# Patient Record
Sex: Female | Born: 1980 | Race: White | Hispanic: No | Marital: Married | Smoking: Former smoker
Health system: Southern US, Community
[De-identification: ages and names within clinical notes are randomized; demographics above are authoritative.]

## PROBLEM LIST (undated history)

## (undated) DIAGNOSIS — K219 Gastro-esophageal reflux disease without esophagitis: Secondary | ICD-10-CM

## (undated) DIAGNOSIS — R519 Headache, unspecified: Secondary | ICD-10-CM

## (undated) DIAGNOSIS — R51 Headache: Secondary | ICD-10-CM

## (undated) HISTORY — DX: Headache, unspecified: R51.9

## (undated) HISTORY — DX: Headache: R51

## (undated) HISTORY — PX: OOPHORECTOMY: SHX86

---

## 2004-11-23 ENCOUNTER — Emergency Department: Payer: Self-pay | Admitting: Emergency Medicine

## 2005-10-14 ENCOUNTER — Emergency Department: Payer: Self-pay | Admitting: Emergency Medicine

## 2005-12-05 ENCOUNTER — Emergency Department: Payer: Self-pay | Admitting: Emergency Medicine

## 2006-05-19 ENCOUNTER — Other Ambulatory Visit: Payer: Self-pay

## 2006-05-19 ENCOUNTER — Emergency Department: Payer: Self-pay | Admitting: Emergency Medicine

## 2006-05-20 ENCOUNTER — Ambulatory Visit: Payer: Self-pay | Admitting: Emergency Medicine

## 2006-06-09 ENCOUNTER — Ambulatory Visit: Payer: Self-pay | Admitting: Surgery

## 2006-11-24 ENCOUNTER — Emergency Department: Payer: Self-pay | Admitting: Emergency Medicine

## 2007-06-24 HISTORY — PX: TUBAL LIGATION: SHX77

## 2007-10-19 ENCOUNTER — Observation Stay: Payer: Self-pay | Admitting: Obstetrics and Gynecology

## 2008-09-20 ENCOUNTER — Emergency Department: Payer: Self-pay | Admitting: Emergency Medicine

## 2009-05-13 ENCOUNTER — Emergency Department: Payer: Self-pay | Admitting: Emergency Medicine

## 2009-07-08 ENCOUNTER — Emergency Department: Payer: Self-pay | Admitting: Emergency Medicine

## 2009-09-13 ENCOUNTER — Emergency Department: Payer: Self-pay | Admitting: Emergency Medicine

## 2009-10-08 ENCOUNTER — Ambulatory Visit: Payer: Self-pay | Admitting: Obstetrics & Gynecology

## 2009-10-16 ENCOUNTER — Ambulatory Visit: Payer: Self-pay | Admitting: Obstetrics & Gynecology

## 2010-08-02 ENCOUNTER — Emergency Department: Payer: Self-pay | Admitting: Unknown Physician Specialty

## 2010-11-04 ENCOUNTER — Ambulatory Visit: Payer: Self-pay | Admitting: Obstetrics & Gynecology

## 2011-04-03 ENCOUNTER — Emergency Department: Payer: Self-pay | Admitting: Emergency Medicine

## 2011-06-02 ENCOUNTER — Emergency Department: Payer: Self-pay | Admitting: Emergency Medicine

## 2011-07-04 ENCOUNTER — Emergency Department: Payer: Self-pay | Admitting: Unknown Physician Specialty

## 2011-07-05 ENCOUNTER — Emergency Department: Payer: Self-pay | Admitting: Emergency Medicine

## 2011-07-15 ENCOUNTER — Encounter (HOSPITAL_COMMUNITY): Payer: Self-pay | Admitting: Emergency Medicine

## 2011-07-15 ENCOUNTER — Emergency Department (HOSPITAL_COMMUNITY)
Admission: EM | Admit: 2011-07-15 | Discharge: 2011-07-15 | Discharge status: Against Medical Advice | Payer: Self-pay | Attending: Emergency Medicine | Admitting: Emergency Medicine

## 2011-07-15 DIAGNOSIS — R5381 Other malaise: Secondary | ICD-10-CM | POA: Insufficient documentation

## 2011-07-15 DIAGNOSIS — R110 Nausea: Secondary | ICD-10-CM | POA: Insufficient documentation

## 2011-07-15 DIAGNOSIS — K219 Gastro-esophageal reflux disease without esophagitis: Secondary | ICD-10-CM | POA: Insufficient documentation

## 2011-07-15 HISTORY — DX: Gastro-esophageal reflux disease without esophagitis: K21.9

## 2011-07-15 NOTE — ED Notes (Signed)
UNABLE TO LOCATE PT. AT TRIAGE SEVERAL TIMES.  

## 2011-07-15 NOTE — ED Notes (Signed)
Pt c/o nausea and fatigue with acid reflux and dry cough x several months

## 2011-08-21 ENCOUNTER — Emergency Department: Payer: Self-pay | Admitting: *Deleted

## 2011-09-27 ENCOUNTER — Emergency Department: Payer: Self-pay | Admitting: *Deleted

## 2011-12-18 ENCOUNTER — Emergency Department: Payer: Self-pay | Admitting: Emergency Medicine

## 2013-05-17 ENCOUNTER — Emergency Department: Payer: Self-pay | Admitting: Emergency Medicine

## 2014-07-18 ENCOUNTER — Emergency Department: Payer: Self-pay | Admitting: Emergency Medicine

## 2014-07-18 LAB — CBC WITH DIFFERENTIAL/PLATELET
BASOS ABS: 0 10*3/uL (ref 0.0–0.1)
BASOS PCT: 0.5 %
EOS PCT: 1.1 %
Eosinophil #: 0.1 10*3/uL (ref 0.0–0.7)
HCT: 39.8 % (ref 35.0–47.0)
HGB: 13.4 g/dL (ref 12.0–16.0)
Lymphocyte #: 2.4 10*3/uL (ref 1.0–3.6)
Lymphocyte %: 32.3 %
MCH: 30.7 pg (ref 26.0–34.0)
MCHC: 33.5 g/dL (ref 32.0–36.0)
MCV: 92 fL (ref 80–100)
Monocyte #: 0.6 x10 3/mm (ref 0.2–0.9)
Monocyte %: 8.1 %
NEUTROS PCT: 58 %
Neutrophil #: 4.4 10*3/uL (ref 1.4–6.5)
Platelet: 295 10*3/uL (ref 150–440)
RBC: 4.35 10*6/uL (ref 3.80–5.20)
RDW: 13.2 % (ref 11.5–14.5)
WBC: 7.6 10*3/uL (ref 3.6–11.0)

## 2014-07-18 LAB — URINALYSIS, COMPLETE
BILIRUBIN, UR: NEGATIVE
Blood: NEGATIVE
GLUCOSE, UR: NEGATIVE mg/dL (ref 0–75)
Nitrite: NEGATIVE
Ph: 5 (ref 4.5–8.0)
Protein: NEGATIVE
Specific Gravity: 1.01 (ref 1.003–1.030)
Transitional Epi: 1

## 2014-07-18 LAB — COMPREHENSIVE METABOLIC PANEL
ALBUMIN: 4.2 g/dL (ref 3.4–5.0)
ALK PHOS: 58 U/L (ref 46–116)
ALT: 22 U/L (ref 14–63)
AST: 31 U/L (ref 15–37)
Anion Gap: 7 (ref 7–16)
BUN: 9 mg/dL (ref 7–18)
Bilirubin,Total: 0.3 mg/dL (ref 0.2–1.0)
CHLORIDE: 104 mmol/L (ref 98–107)
Calcium, Total: 9.4 mg/dL (ref 8.5–10.1)
Co2: 29 mmol/L (ref 21–32)
Creatinine: 0.75 mg/dL (ref 0.60–1.30)
Glucose: 87 mg/dL (ref 65–99)
Osmolality: 277 (ref 275–301)
POTASSIUM: 4 mmol/L (ref 3.5–5.1)
SODIUM: 140 mmol/L (ref 136–145)
TOTAL PROTEIN: 7.8 g/dL (ref 6.4–8.2)

## 2014-07-18 LAB — LIPASE, BLOOD: LIPASE: 101 U/L (ref 73–393)

## 2014-07-20 LAB — URINE CULTURE

## 2014-12-27 ENCOUNTER — Emergency Department: Payer: Worker's Compensation

## 2014-12-27 ENCOUNTER — Encounter: Payer: Self-pay | Admitting: *Deleted

## 2014-12-27 ENCOUNTER — Emergency Department
Admission: EM | Admit: 2014-12-27 | Discharge: 2014-12-27 | Disposition: A | Discharge status: Home / Self Care | Payer: Worker's Compensation | Attending: Emergency Medicine | Admitting: Emergency Medicine

## 2014-12-27 DIAGNOSIS — X58XXXA Exposure to other specified factors, initial encounter: Secondary | ICD-10-CM | POA: Insufficient documentation

## 2014-12-27 DIAGNOSIS — Y9289 Other specified places as the place of occurrence of the external cause: Secondary | ICD-10-CM | POA: Insufficient documentation

## 2014-12-27 DIAGNOSIS — S93401A Sprain of unspecified ligament of right ankle, initial encounter: Secondary | ICD-10-CM | POA: Diagnosis not present

## 2014-12-27 DIAGNOSIS — Y9389 Activity, other specified: Secondary | ICD-10-CM | POA: Diagnosis not present

## 2014-12-27 DIAGNOSIS — Z720 Tobacco use: Secondary | ICD-10-CM | POA: Diagnosis not present

## 2014-12-27 DIAGNOSIS — Y998 Other external cause status: Secondary | ICD-10-CM | POA: Diagnosis not present

## 2014-12-27 DIAGNOSIS — S99911A Unspecified injury of right ankle, initial encounter: Secondary | ICD-10-CM | POA: Diagnosis present

## 2014-12-27 MED ORDER — HYDROCODONE-ACETAMINOPHEN 5-325 MG PO TABS: 1.0000 | ORAL_TABLET | ORAL | Status: DC | PRN

## 2014-12-27 NOTE — ED Notes (Signed)
[facility] [employer] [employer] due to twisted ankle while at work. Pt was taking recyclables out to trash when tripped over "a pile of rocks that I slipped on and twisted my right ankle." Pt states pain 6/10, no deformity noted. Pt ambulatory to triage, pedal pulses present and strong bilaterally. Vitals wnl, no acute distress noted.

## 2014-12-27 NOTE — ED Provider Notes (Signed)
[facility] Emergency Department Provider Note  ____________________________________________  Time seen: Approximately 2:25 PM  I have reviewed the triage vital signs and the nursing notes.   HISTORY  Chief Complaint Ankle Pain    HPI Leah Christensen is a 34 y.o. female presents for evaluation of right ankle pain. States that she twisted her ankle on some rocks and heard a "pop" sound.Increased pain with ambulation and weightbearing. Pain is both laterally and medially. Presently 5/10.   Past Medical History  Diagnosis Date  . Acid reflux     Patient Active Problem List   Diagnosis Date Noted  . Acid reflux     History reviewed. No pertinent past surgical history.  Current Outpatient Rx  Name  Route  Sig  Dispense  Refill  . HYDROcodone-acetaminophen (NORCO) 5-325 MG per tablet   Oral   Take 1-2 tablets by mouth every 4 (four) hours as needed for moderate pain.   15 tablet   0     Allergies Review of patient's allergies indicates no known allergies.  History reviewed. No pertinent family history.  Social History History  Substance Use Topics  . Smoking status: Current Every Day Smoker  . Smokeless tobacco: Not on file  . Alcohol Use: No     Comment: occ    Review of Systems Constitutional: No fever/chills Eyes: No visual changes. ENT: No sore throat. Cardiovascular: Denies chest pain. Respiratory: Denies shortness of breath. Gastrointestinal: No abdominal pain.  No nausea, no vomiting.  No diarrhea.  No constipation. Genitourinary: Negative for dysuria. Musculoskeletal: Positive for right ankle pain and swelling. Skin: Negative for rash. Neurological: Negative for headaches, focal weakness or numbness.  10-point ROS otherwise negative.  ____________________________________________   PHYSICAL EXAM:  VITAL SIGNS: ED Triage Vitals  Enc Vitals Group     BP 12/27/14 1354 107/65 mmHg     Pulse Rate 12/27/14 1354 82   Resp 12/27/14 1354 20     Temp 12/27/14 1354 98.3 F (36.8 C)     Temp Source 12/27/14 1354 Oral     SpO2 12/27/14 1354 99 %     Weight 12/27/14 1354 110 lb (49.896 kg)     Height 12/27/14 1354 5' (1.524 m)     Head Cir --      Peak Flow --      Pain Score 12/27/14 1354 5     Pain Loc --      Pain Edu? --      Excl. in GC? --     Constitutional: Alert and oriented. Well appearing and in no acute distress. Eyes: Conjunctivae are normal. PERRL. EOMI. Head: Atraumatic. Nose: No congestion/rhinnorhea. Mouth/Throat: Mucous membranes are moist.  Oropharynx non-erythematous. Neck: No stridor.   Cardiovascular: Normal rate, regular rhythm. Grossly normal heart sounds.  Good peripheral circulation. Respiratory: Normal respiratory effort.  No retractions. Lungs CTAB. Gastrointestinal: Soft and nontender. No distention. No abdominal bruits. No CVA tenderness. Musculoskeletal: Limited range of motion right ankle increased pain with adduction. Okay with flinching and extension. laterallygreater than medially. Pedal pulse noted Preston strong bilaterally. Neurovascularly intact distally. Neurologic:  Normal speech and language. No gross focal neurologic deficits are appreciated. Speech is normal. No gait instability. Skin:  Skin is warm, dry and intact. No rash noted. Psychiatric: Mood and affect are normal. Speech and behavior are normal.  ____________________________________________   LABS (all labs ordered are listed, but only abnormal results are displayed)  Labs Reviewed - No data to display ____________________________________________  RADIOLOGY  Negative for fracture. Interpreted by radiologist and reviewed by myself. ____________________________________________   PROCEDURES  Procedure(s) performed: None  Critical Care performed: No  ____________________________________________   INITIAL IMPRESSION / ASSESSMENT AND PLAN / ED COURSE  Pertinent labs & imaging results that  were available during my care of the patient were reviewed by me and considered in my medical decision making (see chart for details).  2 right ankle sprain. Rx given for hydrocodone 10/24/2023 encouraged ibuprofen over-the-counter. Prescription crutches given as well as ankle stirrup. She to return to the ER with any worsening symptomology otherwise she voices no other emergency medical complaints at this visit. ____________________________________________   FINAL CLINICAL IMPRESSION(S) / ED DIAGNOSES  Final diagnoses:  Ankle sprain, right, initial encounter      Evangeline Dakin, PA-C 12/27/14 1552  Darien Ramus, MD 12/28/14 [phone]

## 2014-12-27 NOTE — Discharge Instructions (Signed)

## 2015-01-22 ENCOUNTER — Emergency Department
Admission: EM | Admit: 2015-01-22 | Discharge: 2015-01-23 | Disposition: A | Discharge status: Home / Self Care | Payer: Self-pay | Attending: Emergency Medicine | Admitting: Emergency Medicine

## 2015-01-22 ENCOUNTER — Emergency Department: Payer: Self-pay

## 2015-01-22 ENCOUNTER — Encounter: Payer: Self-pay | Admitting: *Deleted

## 2015-01-22 DIAGNOSIS — Z3202 Encounter for pregnancy test, result negative: Secondary | ICD-10-CM | POA: Insufficient documentation

## 2015-01-22 DIAGNOSIS — K529 Noninfective gastroenteritis and colitis, unspecified: Secondary | ICD-10-CM | POA: Insufficient documentation

## 2015-01-22 DIAGNOSIS — R109 Unspecified abdominal pain: Secondary | ICD-10-CM

## 2015-01-22 LAB — CBC WITH DIFFERENTIAL/PLATELET
BASOS PCT: 0 %
Basophils Absolute: 0 10*3/uL (ref 0–0.1)
EOS PCT: 1 %
Eosinophils Absolute: 0.1 10*3/uL (ref 0–0.7)
HEMATOCRIT: 42.7 % (ref 35.0–47.0)
HEMOGLOBIN: 14.3 g/dL (ref 12.0–16.0)
LYMPHS ABS: 0.5 10*3/uL — AB (ref 1.0–3.6)
LYMPHS PCT: 4 %
MCH: 30.9 pg (ref 26.0–34.0)
MCHC: 33.5 g/dL (ref 32.0–36.0)
MCV: 92.3 fL (ref 80.0–100.0)
MONO ABS: 0.7 10*3/uL (ref 0.2–0.9)
MONOS PCT: 5 %
NEUTROS ABS: 12 10*3/uL — AB (ref 1.4–6.5)
Neutrophils Relative %: 90 %
Platelets: 230 10*3/uL (ref 150–440)
RBC: 4.63 MIL/uL (ref 3.80–5.20)
RDW: 13.7 % (ref 11.5–14.5)
WBC: 13.3 10*3/uL — ABNORMAL HIGH (ref 3.6–11.0)

## 2015-01-22 LAB — URINALYSIS COMPLETE WITH MICROSCOPIC (ARMC ONLY)
Bacteria, UA: NONE SEEN
Bilirubin Urine: NEGATIVE
Glucose, UA: NEGATIVE mg/dL
HGB URINE DIPSTICK: NEGATIVE
NITRITE: NEGATIVE
PH: 5 (ref 5.0–8.0)
PROTEIN: NEGATIVE mg/dL
RBC / HPF: NONE SEEN RBC/hpf (ref 0–5)
SPECIFIC GRAVITY, URINE: 1.023 (ref 1.005–1.030)

## 2015-01-22 LAB — COMPREHENSIVE METABOLIC PANEL
ALBUMIN: 4.8 g/dL (ref 3.5–5.0)
ALT: 17 U/L (ref 14–54)
AST: 26 U/L (ref 15–41)
Alkaline Phosphatase: 48 U/L (ref 38–126)
Anion gap: 7 (ref 5–15)
BUN: 13 mg/dL (ref 6–20)
CHLORIDE: 103 mmol/L (ref 101–111)
CO2: 26 mmol/L (ref 22–32)
CREATININE: 0.85 mg/dL (ref 0.44–1.00)
Calcium: 9.3 mg/dL (ref 8.9–10.3)
GFR calc non Af Amer: >60 mL/min (ref >60)
Glucose, Bld: 100 mg/dL — ABNORMAL HIGH (ref 65–99)
POTASSIUM: 4 mmol/L (ref 3.5–5.1)
Sodium: 136 mmol/L (ref 135–145)
TOTAL PROTEIN: 7.8 g/dL (ref 6.5–8.1)
Total Bilirubin: 0.8 mg/dL (ref 0.3–1.2)

## 2015-01-22 LAB — POCT PREGNANCY, URINE: Preg Test, Ur: NEGATIVE

## 2015-01-22 MED ORDER — MORPHINE SULFATE 4 MG/ML IJ SOLN
4.0000 mg | Freq: Once | INTRAMUSCULAR | Status: AC
  Administered 2015-01-22: 4 mg via INTRAVENOUS
  Filled 2015-01-22: qty 1

## 2015-01-22 MED ORDER — ONDANSETRON HCL 4 MG/2ML IJ SOLN
4.0000 mg | Freq: Once | INTRAMUSCULAR | Status: AC
  Administered 2015-01-22: 4 mg via INTRAVENOUS
  Filled 2015-01-22: qty 2

## 2015-01-22 MED ORDER — SODIUM CHLORIDE 0.9 % IV SOLN
Freq: Once | INTRAVENOUS | Status: AC
  Administered 2015-01-22: 22:00:00 via INTRAVENOUS

## 2015-01-22 MED ORDER — KETOROLAC TROMETHAMINE 30 MG/ML IJ SOLN
30.0000 mg | Freq: Once | INTRAMUSCULAR | Status: AC
  Administered 2015-01-22: 30 mg via INTRAVENOUS
  Filled 2015-01-22: qty 10

## 2015-01-22 MED ORDER — IOHEXOL 300 MG/ML  SOLN
75.0000 mL | Freq: Once | INTRAMUSCULAR | Status: AC | PRN
  Administered 2015-01-22: 75 mL via INTRAVENOUS

## 2015-01-22 NOTE — ED Notes (Signed)
poct urine pregnancy-NEGATIVE 

## 2015-01-22 NOTE — ED Provider Notes (Signed)
[facility] Emergency Department Provider Note     Time seen: ----------------------------------------- 9:02 PM on 01/22/2015 -----------------------------------------    I have reviewed the triage vital signs and the nursing notes.   HISTORY  Chief Complaint Abdominal Pain    HPI DIAMOND JENTZ is a 34 y.o. female who presents ER with nausea vomiting diarrhea today. Patient states symptoms began at 8:30 this morning. States the cramping has gotten worse today. No fever, does have chills. She's had no vaginal bleeding or dysuria. Most complained of pain in the right side.   Past Medical History  Diagnosis Date  . Acid reflux     Patient Active Problem List   Diagnosis Date Noted  . Acid reflux     No past surgical history on file.  Allergies Review of patient's allergies indicates no known allergies.  Social History History  Substance Use Topics  . Smoking status: Former Games developer  . Smokeless tobacco: Not on file  . Alcohol Use: No     Comment: occ    Review of Systems Constitutional: Negative for fever. Positive for chills Eyes: Negative for visual changes. ENT: Negative for sore throat. Cardiovascular: Negative for chest pain. Respiratory: Negative for shortness of breath. Gastrointestinal: Positive for abdominal pain, vomiting and diarrhea Genitourinary: Negative for dysuria. Musculoskeletal: Negative for back pain. Positive for right-sided back pain Skin: Negative for rash. Neurological: Negative for headaches, focal weakness or numbness.  10-point ROS otherwise negative.  ____________________________________________   PHYSICAL EXAM:  VITAL SIGNS: ED Triage Vitals  Enc Vitals Group     BP 01/22/15 1941 101/70 mmHg     Pulse Rate 01/22/15 1941 98     Resp 01/22/15 1941 18     Temp 01/22/15 1941 98.6 F (37 C)     Temp Source 01/22/15 1941 Oral     SpO2 01/22/15 1941 99 %     Weight 01/22/15 1941 113 lb (51.256  kg)     Height 01/22/15 1941 5' (1.524 m)     Head Cir --      Peak Flow --      Pain Score 01/22/15 1943 8     Pain Loc --      Pain Edu? --      Excl. in GC? --     Constitutional: Alert and oriented. Mild distress Eyes: Conjunctivae are normal. PERRL. Normal extraocular movements. ENT   Head: Normocephalic and atraumatic.   Nose: No congestion/rhinnorhea.   Mouth/Throat: Mucous membranes are moist.   Neck: No stridor. Cardiovascular: Normal rate, regular rhythm. Normal and symmetric distal pulses are present in all extremities. No murmurs, rubs, or gallops. Respiratory: Normal respiratory effort without tachypnea nor retractions. Breath sounds are clear and equal bilaterally. No wheezes/rales/rhonchi. Gastrointestinal: Mild right-sided nonfocal abdominal tenderness, no rebound or guarding. Musculoskeletal: Nontender with normal range of motion in all extremities. No joint effusions.  No lower extremity tenderness nor edema. Neurologic:  Normal speech and language. No gross focal neurologic deficits are appreciated. Speech is normal. No gait instability. Skin:  Skin is warm, dry and intact. No rash noted. Psychiatric: Mood and affect are normal. Speech and behavior are normal. Patient exhibits appropriate insight and judgment.  ____________________________________________  ED COURSE:  Pertinent labs & imaging results that were available during my care of the patient were reviewed by me and considered in my medical decision making (see chart for details). Patient will need IV fluid, morphine and Zofran. This appears to be gastroenteritis. ____________________________________________  LABS (pertinent positives/negatives)  Labs Reviewed  CBC WITH DIFFERENTIAL/PLATELET - Abnormal; Notable for the following:    WBC 13.3 (*)    Neutro Abs 12.0 (*)    Lymphs Abs 0.5 (*)    All other components within normal limits  COMPREHENSIVE METABOLIC PANEL - Abnormal; Notable  for the following:    Glucose, Bld 100 (*)    All other components within normal limits  URINALYSIS COMPLETEWITH MICROSCOPIC ([facility] ONLY) - Abnormal; Notable for the following:    Color, Urine YELLOW (*)    APPearance HAZY (*)    Ketones, ur 2+ (*)    Leukocytes, UA 1+ (*)    Squamous Epithelial / LPF 6-30 (*)    All other components within normal limits  POC URINE PREG, ED  POCT PREGNANCY, URINE    RADIOLOGY Images were viewed by me  Abdomen 2 view is unremarkable  ____________________________________________  FINAL ASSESSMENT AND PLAN  Gastroenteritis  Plan: Patient with labs and imaging as dictated above. Patient was given saline, morphine, Zofran, Toradol. She is stable for outpatient follow-up for Dr.   Mayford Knife, Cecille Amsterdam, MD   Emily Filbert, MD 01/22/15 2119

## 2015-01-22 NOTE — ED Notes (Signed)
Pt to CT

## 2015-01-22 NOTE — ED Notes (Signed)
Returned from ct 

## 2015-01-22 NOTE — ED Notes (Signed)
Pt has abd pain with n/v/d today.  Sx began at 0830 this morning.  Pt sates the cramping has gotten worse today.  No vag bleeding.   No dysuria.  Pt has low back.

## 2015-01-23 MED ORDER — DICYCLOMINE HCL 10 MG PO CAPS: 10.0000 mg | ORAL_CAPSULE | Freq: Three times a day (TID) | ORAL | Status: DC

## 2015-01-23 MED ORDER — DICYCLOMINE HCL 10 MG PO CAPS
10.0000 mg | ORAL_CAPSULE | Freq: Once | ORAL | Status: AC
  Administered 2015-01-23: 10 mg via ORAL
  Filled 2015-01-23: qty 1

## 2015-01-23 MED ORDER — LOPERAMIDE HCL 2 MG PO CAPS
4.0000 mg | ORAL_CAPSULE | Freq: Once | ORAL | Status: AC
  Administered 2015-01-23: 4 mg via ORAL
  Filled 2015-01-23: qty 2

## 2015-01-23 NOTE — Discharge Instructions (Signed)

## 2015-01-23 NOTE — ED Provider Notes (Signed)
I assumed care of the patient at 11:00 PM from Dr. Shaune Pollack. She scan of the abdomen revealed:    CT Abdomen Pelvis W Contrast (Final result) Result time: 01/22/15 23:23:03   Final result by Rad Results In Interface (01/22/15 23:23:03)   Narrative:   CLINICAL DATA: Acute onset of generalized abdominal pain, nausea, vomiting and diarrhea. Initial encounter.  EXAM: CT ABDOMEN AND PELVIS WITH CONTRAST  TECHNIQUE: Multidetector CT imaging of the abdomen and pelvis was performed using the standard protocol following bolus administration of intravenous contrast.  CONTRAST: 75mL OMNIPAQUE IOHEXOL 300 MG/ML SOLN  COMPARISON: CT of the abdomen and pelvis from 07/18/2014  FINDINGS: The visualized lung bases are clear.  The liver and spleen are unremarkable in appearance. The patient is status post cholecystectomy, with clips noted at the gallbladder fossa. The pancreas and adrenal glands are unremarkable.  The kidneys are unremarkable in appearance. There is no evidence of hydronephrosis. No renal or ureteral stones are seen. No perinephric stranding is appreciated.  The small bowel is unremarkable in appearance. The stomach is within normal limits. No acute vascular abnormalities are seen.  The appendix is not definitely seen; there is no evidence of appendicitis. The colon is partially filled with fluid and is grossly unremarkable in appearance.  The bladder is mildly distended and grossly unremarkable. The uterus is within normal limits. The right ovary is unremarkable in appearance. Trace free fluid within the pelvis is likely physiologic in nature. No suspicious adnexal masses are seen. No inguinal lymphadenopathy is seen.  No acute osseous abnormalities are identified.  IMPRESSION: 1. No acute abnormality seen within the abdomen or pelvis. 2. Trace free fluid in the pelvis is likely physiologic in nature.   Electronically Signed   Patient given Imodium 4 mg and  Bentyl 10 mg by mouth. Patient will be prescribed the same at home.  Darci Current, MD 01/23/15 [id number]

## 2015-04-27 ENCOUNTER — Encounter: Payer: Self-pay | Admitting: Primary Care

## 2015-04-27 ENCOUNTER — Ambulatory Visit (INDEPENDENT_AMBULATORY_CARE_PROVIDER_SITE_OTHER): Payer: 59 | Admitting: Primary Care

## 2015-04-27 VITALS — BP 96/64 | HR 83 | Temp 98.1°F | Ht 60.0 in | Wt 113.4 lb

## 2015-04-27 DIAGNOSIS — R059 Cough, unspecified: Secondary | ICD-10-CM

## 2015-04-27 DIAGNOSIS — N644 Mastodynia: Secondary | ICD-10-CM | POA: Diagnosis not present

## 2015-04-27 DIAGNOSIS — R05 Cough: Secondary | ICD-10-CM

## 2015-04-27 DIAGNOSIS — R51 Headache: Secondary | ICD-10-CM

## 2015-04-27 DIAGNOSIS — R519 Headache, unspecified: Secondary | ICD-10-CM | POA: Insufficient documentation

## 2015-04-27 MED ORDER — AMOXICILLIN 400 MG/5ML PO SUSR: ORAL | Status: DC

## 2015-04-27 MED ORDER — HYDROCODONE-HOMATROPINE 5-1.5 MG/5ML PO SYRP: 5.0000 mL | ORAL_SOLUTION | Freq: Every evening | ORAL | Status: DC | PRN

## 2015-04-27 NOTE — Patient Instructions (Signed)
Start Amoxicillin suspension antibiotics. Take 10 ml by mouth twice daily for 7 days. Please notify me if you do not start feeling better.  You may take the Hycodan at bedtime for cough and sleep. If this is too expensive, try taking Nyquil which may be purchased over the counter.  Stop by the front and speak with Shirlee LimerickMarion regarding your mammogram.  It was a pleasure to meet you today! Please don't hesitate to call me with any questions. Welcome to [facility]!

## 2015-04-27 NOTE — Progress Notes (Signed)
Pre visit review using our clinic review tool, if applicable. No additional management support is needed unless otherwise documented below in the visit note. 

## 2015-04-27 NOTE — Assessment & Plan Note (Signed)
Bilateral breasts. Exam with change in density to left breast at 3 o'clock position. Sent for diagnotic mammogram with ultrasound bilaterally. No FH of breast cancer.

## 2015-04-27 NOTE — Assessment & Plan Note (Signed)
Occur twice weekly. +photophobia and nausea. Improved with BC powder and rest. Discussed to stop BC powder, try tylenol or excedrin migraine. She is to update me if HA's become more frequent/worse.

## 2015-04-27 NOTE — Progress Notes (Signed)
   Subjective:    Patient ID: Leah Christensen, female    DOB: 05/15/1981, 34 y.o.   MRN: [id number]  HPI  Ms. Leah Christensen is a 34 year old female who presents today to establish care and discuss the problems mentioned below. Will obtain old records.  1) Cough: Her symptoms first began Thursday last week (10/27) and started with a cough. Over the past week she's started to feel fatigued, developed a sore throat, left ear pain, nasal congestion. Her cough is productive with green sputum. She's been taking alka-seltzer cold and flu tablets without improvement. Nothing improves her symptoms.  2) Frequent headaches: Headaches occur twice weekly. 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 Her headaches are located to the frontal lobe. She will get photophobia and nausea. She will take Surgisite BostonBC powder with improvement.   3) Breast pain: Present for the past 2 months. Located to bilateral breasts. She describes her pain as a constant soreness. Denies discharge from nipples. She's been checking her breasts regularly in the shower and believes she may feel a mass to the left breast at the 3 o'clock position. No family history of breast cancer.  Review of Systems  Constitutional: Negative for unexpected weight change.  HENT: Positive for congestion, ear pain, postnasal drip and sore throat. Negative for sinus pressure.   Respiratory: Positive for cough. Negative for shortness of breath.   Cardiovascular: Negative for chest pain.       Breast pain  Gastrointestinal: Negative for diarrhea and constipation.  Genitourinary: Negative for difficulty urinating.       Regular periods  Musculoskeletal: Negative for myalgias and arthralgias.  Skin: Negative for rash.  Neurological: Positive for numbness and headaches. Negative for dizziness.       Numbness to right hand since FOOSH at age 34.  Psychiatric/Behavioral:       Denies concerns for anxiety or depression         Objective:   Physical Exam  Constitutional: She is oriented to person, place, and time. She appears well-nourished.  HENT:  Right Ear: Tympanic membrane and ear canal normal.  Left Ear: Tympanic membrane and ear canal normal.  Nose: Right sinus exhibits no maxillary sinus tenderness and no frontal sinus tenderness. Left sinus exhibits no maxillary sinus tenderness and no frontal sinus tenderness.  Mouth/Throat: Posterior oropharyngeal erythema present. No oropharyngeal exudate or posterior oropharyngeal edema.  Neck: Neck supple.  Cardiovascular: Normal rate and regular rhythm.   Pulmonary/Chest: Effort normal and breath sounds normal. Right breast exhibits tenderness. Right breast exhibits no mass and no skin change. Left breast exhibits tenderness. Left breast exhibits no skin change. Breasts are symmetrical.    Deep firmness noted to breast at 3 o'clock position.   Lymphadenopathy:    She has cervical adenopathy.  Neurological: She is alert and oriented to person, place, and time.  Skin: Skin is warm and dry.  Psychiatric: She has a normal mood and affect.          Assessment & Plan:  Acute Bronchitis:  Cough x 2 weeks, now with sore throat, fatigue, production with green sputum. Exam mostly unremarkable, although appears ill. RX for amoxicillin suspension as she has difficulty swallowing pills, Hycodan cough syrup PRN HS. Fluids, rest. Follow up PRN.

## 2015-05-02 ENCOUNTER — Other Ambulatory Visit: Payer: Self-pay | Admitting: Primary Care

## 2015-05-02 DIAGNOSIS — Z0000 Encounter for general adult medical examination without abnormal findings: Secondary | ICD-10-CM

## 2015-05-07 ENCOUNTER — Ambulatory Visit
Admission: RE | Admit: 2015-05-07 | Discharge: 2015-05-07 | Disposition: A | Discharge status: Home / Self Care | Payer: 59 | Source: Ambulatory Visit | Attending: Primary Care | Admitting: Primary Care

## 2015-05-07 ENCOUNTER — Telehealth: Payer: Self-pay | Admitting: Primary Care

## 2015-05-07 ENCOUNTER — Other Ambulatory Visit: Payer: Self-pay | Admitting: Primary Care

## 2015-05-07 DIAGNOSIS — N644 Mastodynia: Secondary | ICD-10-CM

## 2015-05-07 NOTE — Telephone Encounter (Signed)
Opened in error pt wanted dr appointment

## 2015-05-09 ENCOUNTER — Encounter: Payer: Self-pay | Admitting: Family Medicine

## 2015-05-09 ENCOUNTER — Ambulatory Visit (INDEPENDENT_AMBULATORY_CARE_PROVIDER_SITE_OTHER): Payer: 59 | Admitting: Family Medicine

## 2015-05-09 VITALS — BP 100/60 | HR 76 | Temp 98.7°F | Wt 113.2 lb

## 2015-05-09 DIAGNOSIS — N63 Unspecified lump in unspecified breast: Secondary | ICD-10-CM

## 2015-05-09 DIAGNOSIS — Z0000 Encounter for general adult medical examination without abnormal findings: Secondary | ICD-10-CM

## 2015-05-09 NOTE — Progress Notes (Signed)
Pre visit review using our clinic review tool, if applicable. No additional management support is needed unless otherwise documented below in the visit note.  F/u for mammogram.  Possible mass in R breast.   Per patient, was given options: f/u imaging vs biopsy vs excision (with biopsy prev).    She had some R>L breast pain that led imaging with the results noted.  No discharge from the nipple.   No known FH breast cancer.   D/w pt about her imaging report, probable benign lesion in the right breast.  Needed referral for consideration of biopsy. General pros and cons d/w pt about continued surveillance vs biopsy.    Due for labs ordered prev by PCP.    Meds, vitals, and allergies reviewed.   ROS: See HPI.  Otherwise, noncontributory.  nad ncat rrr ctab Breast exam not done.

## 2015-05-09 NOTE — Patient Instructions (Signed)
Go to the lab on the way out.  We'll contact you with your lab report. Marion will call about your referral. Take care.  Glad to see you.  

## 2015-05-10 ENCOUNTER — Telehealth: Payer: Self-pay | Admitting: Family Medicine

## 2015-05-10 DIAGNOSIS — N63 Unspecified lump in unspecified breast: Secondary | ICD-10-CM | POA: Insufficient documentation

## 2015-05-10 LAB — LIPID PANEL
CHOL/HDL RATIO: 3
Cholesterol: 128 mg/dL (ref 0–200)
HDL: 45.1 mg/dL (ref >39.00)
LDL CALC: 67 mg/dL (ref 0–99)
NonHDL: 82.53
TRIGLYCERIDES: 78 mg/dL (ref 0.0–149.0)
VLDL: 15.6 mg/dL (ref 0.0–40.0)

## 2015-05-10 LAB — COMPREHENSIVE METABOLIC PANEL
ALK PHOS: 47 U/L (ref 39–117)
ALT: 11 U/L (ref 0–35)
AST: 19 U/L (ref 0–37)
Albumin: 4.3 g/dL (ref 3.5–5.2)
BUN: 9 mg/dL (ref 6–23)
CHLORIDE: 104 meq/L (ref 96–112)
CO2: 27 meq/L (ref 19–32)
Calcium: 9.6 mg/dL (ref 8.4–10.5)
Creatinine, Ser: 0.78 mg/dL (ref 0.40–1.20)
GFR: 89.52 mL/min (ref >60.00)
GLUCOSE: 91 mg/dL (ref 70–99)
POTASSIUM: 3.7 meq/L (ref 3.5–5.1)
SODIUM: 140 meq/L (ref 135–145)
TOTAL PROTEIN: 7.2 g/dL (ref 6.0–8.3)
Total Bilirubin: 0.2 mg/dL (ref 0.2–1.2)

## 2015-05-10 LAB — TSH: TSH: 0.89 u[IU]/mL (ref 0.35–4.50)

## 2015-05-10 NOTE — Telephone Encounter (Signed)
I want patient to talk to Dr. Baird Lyonsina Arceo first.   The patient mentioned that biopsy had been offered, but it isn't mentioned in the reports.  I'll route this to Dr. Judyann MunsonArceo for input.

## 2015-05-10 NOTE — Assessment & Plan Note (Addendum)
D/w pt about options in general.  Probable benign lesion in the right breast. In my experience, most women in her situation usually get f/u imaging done and not a biopsy, at least not initially.  I did put in the referral for consideration of biopsy.   She'll consider options.   I'll ask that pt speak with Leah Christensen again in the meantime, with note routed to Leah Christensen and PCP.  Pt agrees.    Addendum- I see on review that Dr. Judyann Christensen already did d/w pt about possible f/u imaging vs bx vs excision.  I'll defer to patient and Dr. Judyann Christensen, app help for this patient.   She shouldn't need the referral that I placed and we'll cancel that.  Patient should be able to schedule the procedure of her choice.

## 2015-05-10 NOTE — Telephone Encounter (Signed)
Dr. Para Marchuncan, Can you please enter ultrasound guided core biopsy for right breast? [facility] will not schedule without order. Thanks

## 2015-05-11 ENCOUNTER — Ambulatory Visit: Payer: 59

## 2015-05-11 ENCOUNTER — Other Ambulatory Visit: Payer: Self-pay

## 2015-05-11 ENCOUNTER — Other Ambulatory Visit: Payer: Self-pay | Admitting: Primary Care

## 2015-05-11 DIAGNOSIS — N63 Unspecified lump in unspecified breast: Secondary | ICD-10-CM

## 2015-05-15 ENCOUNTER — Ambulatory Visit (INDEPENDENT_AMBULATORY_CARE_PROVIDER_SITE_OTHER): Payer: 59 | Admitting: Primary Care

## 2015-05-15 ENCOUNTER — Encounter: Payer: Self-pay | Admitting: Primary Care

## 2015-05-15 VITALS — BP 106/64 | HR 69 | Temp 98.1°F | Ht 60.0 in | Wt 112.4 lb

## 2015-05-15 DIAGNOSIS — Z0000 Encounter for general adult medical examination without abnormal findings: Secondary | ICD-10-CM | POA: Insufficient documentation

## 2015-05-15 DIAGNOSIS — N63 Unspecified lump in unspecified breast: Secondary | ICD-10-CM

## 2015-05-15 DIAGNOSIS — Z23 Encounter for immunization: Secondary | ICD-10-CM | POA: Diagnosis not present

## 2015-05-15 DIAGNOSIS — N644 Mastodynia: Secondary | ICD-10-CM | POA: Diagnosis not present

## 2015-05-15 NOTE — Progress Notes (Signed)
Pre visit review using our clinic review tool, if applicable. No additional management support is needed unless otherwise documented below in the visit note. 

## 2015-05-15 NOTE — Addendum Note (Signed)
Addended by: Tawnya CrookSAMBATH, Aprile Dickenson on: 05/15/2015 02:06 PM   Modules accepted: Orders

## 2015-05-15 NOTE — Progress Notes (Signed)
Subjective:    Patient ID: Leah Christensen, female    DOB: 07/11/1980, 34 y.o.   MRN: [id number]  HPI  Leah Christensen is a 34 year old female who presents today for complete physical.  Immunizations: -Tetanus: Unsure.  -Influenza: Due today.    Diet: Endorses a fair diet. Breakfast: Yogurt or fruit. Snack: Crackers, goldfish, trail mix Lunch: TV frozen dinner, eats out Snack: crackers, goldfish, trail mix Dinner: Chicken nuggets, chicken, pork, vegetables, green beans, mac and cheese. Desserts: Occasionally chocolate Beverages: Water, soda occasionally, coffee daily Exercise: She is not currently exercising. Eye exam: Completed 2 years ago. Dental exam: Completed years ago. Pap Smear: Completed 2 years ago Mammogram: Completed. Has benign cyst, due for biopsy on 12/02.   Review of Systems  Constitutional: Negative for unexpected weight change.  HENT: Negative for rhinorrhea.   Respiratory: Negative for cough and shortness of breath.   Cardiovascular: Negative for chest pain.  Gastrointestinal: Negative for diarrhea.       Bowel movements are 2-3 days  Genitourinary: Negative for difficulty urinating.  Musculoskeletal: Negative for myalgias and arthralgias.  Skin: Negative for rash.  Neurological: Negative for dizziness, numbness and headaches.  Psychiatric/Behavioral:       Denies concerns for anxiety and depression       Past Medical History  Diagnosis Date  . Acid reflux   . Frequent headaches     Social History   Social History  . Marital Status: Married    Spouse Name: N/A  . Number of Children: N/A  . Years of Education: N/A   Occupational History  . Not on file.   Social History Main Topics  . Smoking status: Former Games developermoker  . Smokeless tobacco: Never Used  . Alcohol Use: 0.0 oz/week    0 Standard drinks or equivalent per week     Comment: occ  . Drug Use: No  . Sexual Activity: Not on file   Other Topics Concern  . Not on file   Social  History Narrative   Married.   4 children.   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   Enjoys spending time with family and friends.    Past Surgical History  Procedure Laterality Date  . Tubal ligation  2009  . Oophorectomy Left     Family History  Problem Relation Age of Onset  . Alcohol abuse Father   . Alcohol abuse Maternal Aunt   . Mental illness Maternal Uncle   . Alcohol abuse Maternal Grandmother   . Arthritis Maternal Grandmother   . Hypertension Maternal Grandmother   . Pancreatic cancer Maternal Grandmother     Allergies  Allergen Reactions  . Sulfamethoxazole-Trimethoprim Rash    No current outpatient prescriptions on file prior to visit.   No current facility-administered medications on file prior to visit.    BP 106/64 mmHg  Pulse 69  Temp(Src) 98.1 F (36.7 C) (Oral)  Ht 5' (1.524 m)  Wt 112 lb 6.4 oz (50.984 kg)  BMI 21.95 kg/m2  SpO2 99%  LMP 05/14/2015    Objective:   Physical Exam  Constitutional: She is oriented to person, place, and time. She appears well-nourished.  HENT:  Right Ear: Tympanic membrane and ear canal normal.  Left Ear: Tympanic membrane and ear canal normal.  Mouth/Throat: Oropharynx is clear and moist.  Eyes: Conjunctivae and EOM are normal. Pupils are equal, round, and reactive to light.  Neck: Neck supple. No thyromegaly present.  Cardiovascular: Normal rate and  regular rhythm.   Pulmonary/Chest: Effort normal and breath sounds normal.  Abdominal: Soft. Bowel sounds are normal.  Musculoskeletal: Normal range of motion.  Lymphadenopathy:    She has no cervical adenopathy.  Neurological: She is alert and oriented to person, place, and time. She has normal reflexes.  Skin: Skin is warm and dry.  Psychiatric: She has a normal mood and affect.          Assessment & Plan:

## 2015-05-15 NOTE — Assessment & Plan Note (Signed)
Mammogram shows probable benign cyst, due for biopsy in early December per patient's request.

## 2015-05-15 NOTE — Assessment & Plan Note (Signed)
Due for biopsy in early December per patient's request. Mammogram reveals likely benign cyst.

## 2015-05-15 NOTE — Assessment & Plan Note (Signed)
Tdap and flu provided today. Pap due next year.  Exam and labs unremarkable. Discussed importance of healthy diet and regular exercise. Follow up in 1 year for repeat physical.

## 2015-05-15 NOTE — Patient Instructions (Signed)
You've been provided with a tetanus vaccination. You are covered for 10 years. You've been provided with an influenza vaccination and will be covered for this season.  Start exercising. You should be getting 1 hour of moderate intensity exercise 5 days weekly.  Work to increase consumption of vegetables and water.  Follow up in 1 year for repeat physical or sooner if needed.  It was a pleasure to see you today!

## 2015-05-25 ENCOUNTER — Ambulatory Visit
Admission: RE | Admit: 2015-05-25 | Discharge: 2015-05-25 | Disposition: A | Discharge status: Home / Self Care | Payer: 59 | Source: Ambulatory Visit | Attending: Primary Care | Admitting: Primary Care

## 2015-05-25 ENCOUNTER — Other Ambulatory Visit: Payer: Self-pay | Admitting: Primary Care

## 2015-05-25 DIAGNOSIS — N63 Unspecified lump in unspecified breast: Secondary | ICD-10-CM

## 2015-05-30 ENCOUNTER — Encounter: Payer: Self-pay | Admitting: *Deleted

## 2015-08-21 ENCOUNTER — Encounter: Payer: Self-pay | Admitting: Primary Care

## 2015-08-21 ENCOUNTER — Ambulatory Visit (INDEPENDENT_AMBULATORY_CARE_PROVIDER_SITE_OTHER): Payer: 59 | Admitting: Primary Care

## 2015-08-21 VITALS — BP 104/66 | HR 91 | Temp 98.1°F | Ht 60.0 in | Wt 109.0 lb

## 2015-08-21 DIAGNOSIS — J069 Acute upper respiratory infection, unspecified: Secondary | ICD-10-CM

## 2015-08-21 LAB — POCT INFLUENZA A/B
INFLUENZA A, POC: NEGATIVE
Influenza B, POC: NEGATIVE

## 2015-08-21 NOTE — Progress Notes (Signed)
Pre visit review using our clinic review tool, if applicable. No additional management support is needed unless otherwise documented below in the visit note. 

## 2015-08-21 NOTE — Progress Notes (Signed)
Subjective:    Patient ID: Leah Christensen, female    DOB: [birth year], 35 y.o.   MRN: [id number]  HPI  Leah Christensen is a 35 year old female who presents today with a chief complaint of sore throat. She also reports nasal congestion, nausea, cough, headache, chills. Her symptoms hit suddenly yesterday afternoon while at work. She was recently exposed to the flu by her cousin who was diagnosed 2 days ago. She's not taken anything OTC for her symptoms. Her cough is non productive. She's not checked her temperature at home but felt feverish.  Review of Systems  Constitutional: Positive for chills and fatigue. Negative for fever.  HENT: Positive for congestion, sinus pressure and sore throat.   Respiratory: Positive for cough. Negative for shortness of breath.   Gastrointestinal: Positive for nausea.  Musculoskeletal: Positive for myalgias.       Past Medical History  Diagnosis Date  . Acid reflux   . Frequent headaches     Social History   Social History  . Marital Status: Married    Spouse Name: N/A  . Number of Children: N/A  . Years of Education: N/A   Occupational History  . Not on file.   Social History Main Topics  . Smoking status: Former Games developer  . Smokeless tobacco: Never Used  . Alcohol Use: 0.0 oz/week    0 Standard drinks or equivalent per week     Comment: occ  . Drug Use: No  . Sexual Activity: Not on file   Other Topics Concern  . Not on file   Social History Narrative   Married.   4 children.   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   Enjoys spending time with family and friends.    Past Surgical History  Procedure Laterality Date  . Tubal ligation  2009  . Oophorectomy Left     Family History  Problem Relation Age of Onset  . Alcohol abuse Father   . Alcohol abuse Maternal Aunt   . Mental illness Maternal Uncle   . Alcohol abuse Maternal Grandmother   . Arthritis Maternal Grandmother   . Hypertension Maternal Grandmother   .  Pancreatic cancer Maternal Grandmother     Allergies  Allergen Reactions  . Sulfamethoxazole-Trimethoprim Rash    No current outpatient prescriptions on file prior to visit.   No current facility-administered medications on file prior to visit.    There were no vitals taken for this visit.    Objective:   Physical Exam  Constitutional: She appears well-nourished.  HENT:  Right Ear: Tympanic membrane and ear canal normal.  Left Ear: Tympanic membrane and ear canal normal.  Nose: Right sinus exhibits frontal sinus tenderness. Right sinus exhibits no maxillary sinus tenderness. Left sinus exhibits frontal sinus tenderness. Left sinus exhibits no maxillary sinus tenderness.  Mouth/Throat: Oropharynx is clear and moist. No oropharyngeal exudate, posterior oropharyngeal edema or posterior oropharyngeal erythema.  Eyes: Conjunctivae are normal.  Neck: Neck supple.  Cardiovascular: Normal rate and regular rhythm.   Pulmonary/Chest: Effort normal and breath sounds normal. She has no wheezes. She has no rales.  Skin: Skin is warm and dry.          Assessment & Plan:  Flu-Like Symptoms:  Cough, sore throat, chills, congestion, body aches hit suddenly yesterday. Exam with clear lungs, mostly unremarkable ENT exam.  Does not appear sickly, vitals stable. Rapid Flu: Negative. Suspect viral involvement and will treat with supportive measures. Flonase, tylenol/ibuprofen, chloraseptic spray,  fluids, rest.  Return precautions provided.

## 2015-08-21 NOTE — Addendum Note (Signed)
Addended by: Tawnya Crook on: 08/21/2015 02:59 PM   Modules accepted: Orders, SmartSet

## 2015-08-21 NOTE — Patient Instructions (Signed)
Your flu test was negative.  Your symptoms still represent a viral illness which cannot be treated with antibiotics. A viral illness will typically clear on its own in 5-7 days.  Nasal Congestion: Try using Flonase (fluticasone) nasal spray. Instill 2 sprays in each nostril once daily.   Sore throat: Continue ibuprofen/tyelnol. Try chloraseptic spray or throat lozenges.  Cough: Robitussin or Delsym. These may be purchased over the counter.  Please notify me if you develop persistent fevers of 101, start coughing up green mucous, notice increased fatigue or weakness, or feel worse after 1 week of onset of symptoms.   Increase consumption of water intake and rest.  It was a pleasure to see you today!  Upper Respiratory Infection, Adult Most upper respiratory infections (URIs) are a viral infection of the air passages leading to the lungs. A URI affects the nose, throat, and upper air passages. The most common type of URI is nasopharyngitis and is typically referred to as "the common cold." URIs run their course and usually go away on their own. Most of the time, a URI does not require medical attention, but sometimes a bacterial infection in the upper airways can follow a viral infection. This is called a secondary infection. Sinus and middle ear infections are common types of secondary upper respiratory infections. Bacterial pneumonia can also complicate a URI. A URI can worsen asthma and chronic obstructive pulmonary disease (COPD). Sometimes, these complications can require emergency medical care and may be life threatening.  CAUSES Almost all URIs are caused by viruses. A virus is a type of germ and can spread from one person to another.  RISKS FACTORS You may be at risk for a URI if:   You smoke.   You have chronic heart or lung disease.  You have a weakened defense (immune) system.   You are very young or very old.   You have nasal allergies or asthma.  You work in crowded or  poorly ventilated areas.  You work in health care facilities or schools. SIGNS AND SYMPTOMS  Symptoms typically develop 2-3 days after you come in contact with a cold virus. Most viral URIs last 7-10 days. However, viral URIs from the influenza virus (flu virus) can last 14-18 days and are typically more severe. Symptoms may include:   Runny or stuffy (congested) nose.   Sneezing.   Cough.   Sore throat.   Headache.   Fatigue.   Fever.   Loss of appetite.   Pain in your forehead, behind your eyes, and over your cheekbones (sinus pain).  Muscle aches.  DIAGNOSIS  Your health care provider may diagnose a URI by:  Physical exam.  Tests to check that your symptoms are not due to another condition such as:  Strep throat.  Sinusitis.  Pneumonia.  Asthma. TREATMENT  A URI goes away on its own with time. It cannot be cured with medicines, but medicines may be prescribed or recommended to relieve symptoms. Medicines may help:  Reduce your fever.  Reduce your cough.  Relieve nasal congestion. HOME CARE INSTRUCTIONS   Take medicines only as directed by your health care provider.   Gargle warm saltwater or take cough drops to comfort your throat as directed by your health care provider.  Use a warm mist humidifier or inhale steam from a shower to increase air moisture. This may make it easier to breathe.  Drink enough fluid to keep your urine clear or pale yellow.   Eat soups and other  clear broths and maintain good nutrition.   Rest as needed.   Return to work when your temperature has returned to normal or as your health care provider advises. You may need to stay home longer to avoid infecting others. You can also use a face mask and careful hand washing to prevent spread of the virus.  Increase the usage of your inhaler if you have asthma.   Do not use any tobacco products, including cigarettes, chewing tobacco, or electronic cigarettes. If you  need help quitting, ask your health care provider. PREVENTION  The best way to protect yourself from getting a cold is to practice good hygiene.   Avoid oral or hand contact with people with cold symptoms.   Wash your hands often if contact occurs.  There is no clear evidence that vitamin C, vitamin E, echinacea, or exercise reduces the chance of developing a cold. However, it is always recommended to get plenty of rest, exercise, and practice good nutrition.  SEEK MEDICAL CARE IF:   You are getting worse rather than better.   Your symptoms are not controlled by medicine.   You have chills.  You have worsening shortness of breath.  You have brown or red mucus.  You have yellow or brown nasal discharge.  You have pain in your face, especially when you bend forward.  You have a fever.  You have swollen neck glands.  You have pain while swallowing.  You have white areas in the back of your throat. SEEK IMMEDIATE MEDICAL CARE IF:   You have severe or persistent:  Headache.  Ear pain.  Sinus pain.  Chest pain.  You have chronic lung disease and any of the following:  Wheezing.  Prolonged cough.  Coughing up blood.  A change in your usual mucus.  You have a stiff neck.  You have changes in your:  Vision.  Hearing.  Thinking.  Mood. MAKE SURE YOU:   Understand these instructions.  Will watch your condition.  Will get help right away if you are not doing well or get worse.   This information is not intended to replace advice given to you by your health care provider. Make sure you discuss any questions you have with your health care provider.   Document Released: 12/03/2000 Document Revised: 10/24/2014 Document Reviewed: 09/14/2013 Elsevier Interactive Patient Education [employer]

## 2016-09-10 IMAGING — CT CT ABD-PELV W/ CM
1 of 2 series · 15 of 32 positions shown, 19 images · IV contrast (omnipaque)
Comparison: CT of the abdomen and pelvis from 07/18/2014

CLINICAL DATA: Acute onset of generalized abdominal pain, nausea,
vomiting and diarrhea. Initial encounter.

EXAM:
CT ABDOMEN AND PELVIS WITH CONTRAST
TECHNIQUE: Multidetector CT imaging of the abdomen and pelvis was performed
using the standard protocol following bolus administration of
intravenous contrast.
CONTRAST:  75mL OMNIPAQUE IOHEXOL 300 MG/ML  SOLN

[Series 2: routine abd pel with · axial · 0.64mm/px · z∈[-476,-80]mm · 15 of 87 slices shown, 19 images]
[im 4/87  soft-tissue]
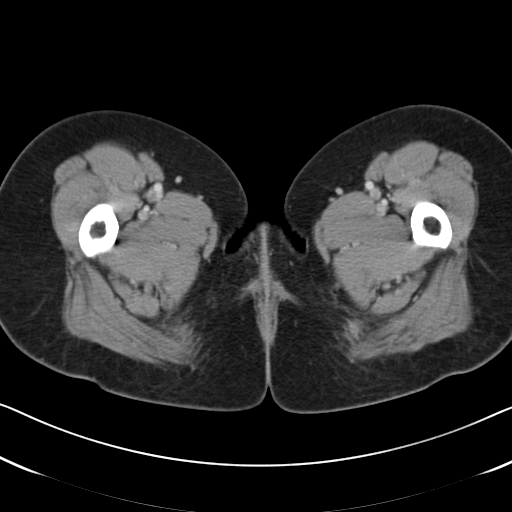
[im 4/87  bone]
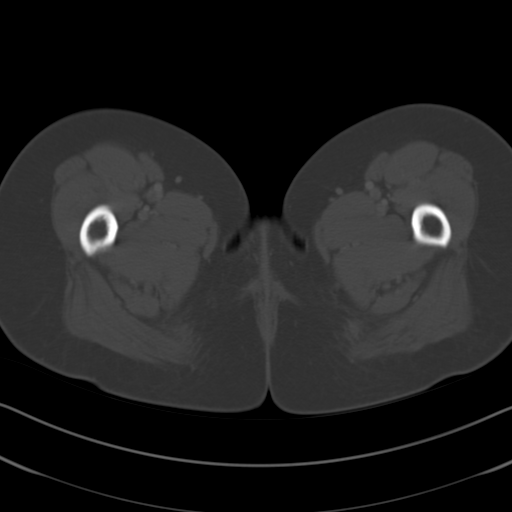
[im 11/87  soft-tissue]
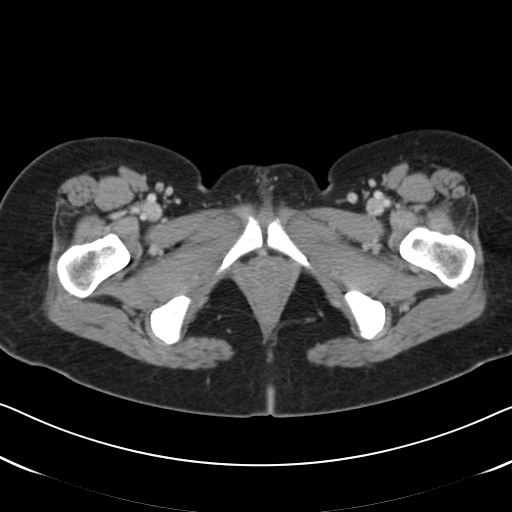
[im 18/87  soft-tissue]
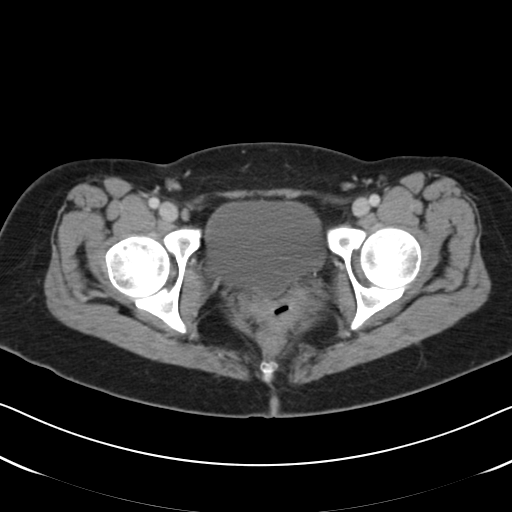
[im 26/87  soft-tissue]
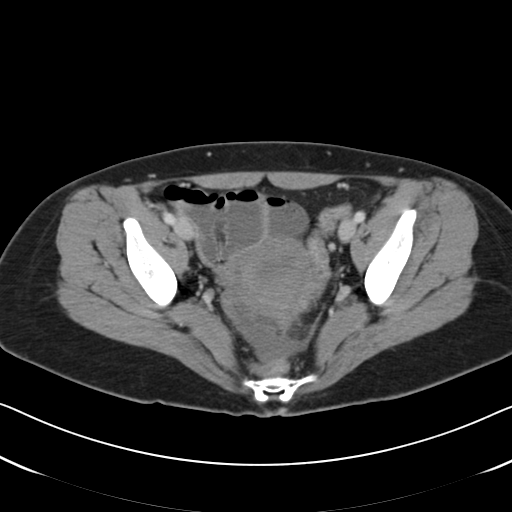
[im 29/87  soft-tissue]
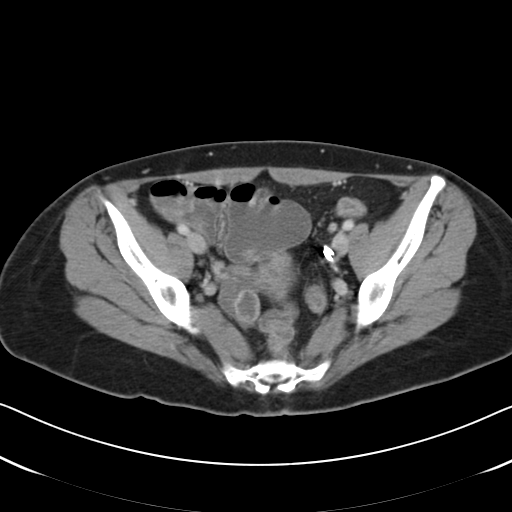
[im 36/87  soft-tissue]
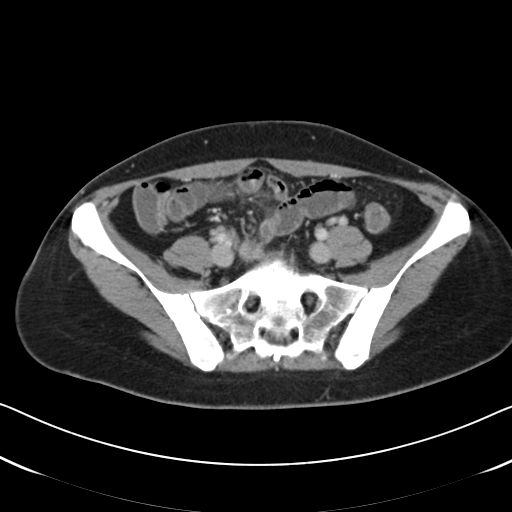
[im 44/87  soft-tissue]
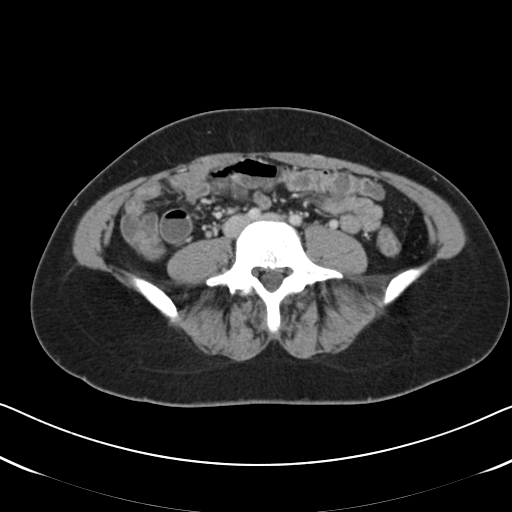
[im 51/87  soft-tissue]
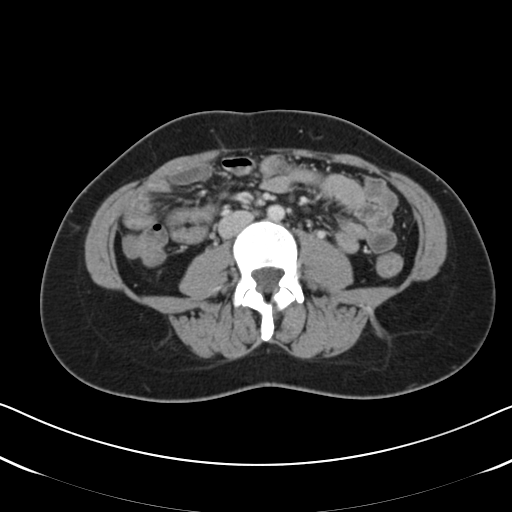
[im 58/87  soft-tissue]
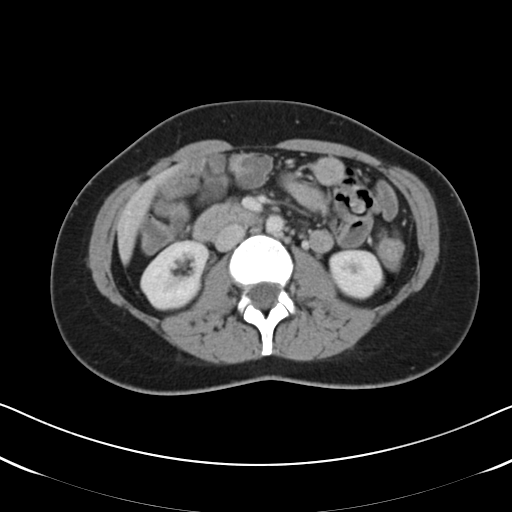
[im 58/87  bone]
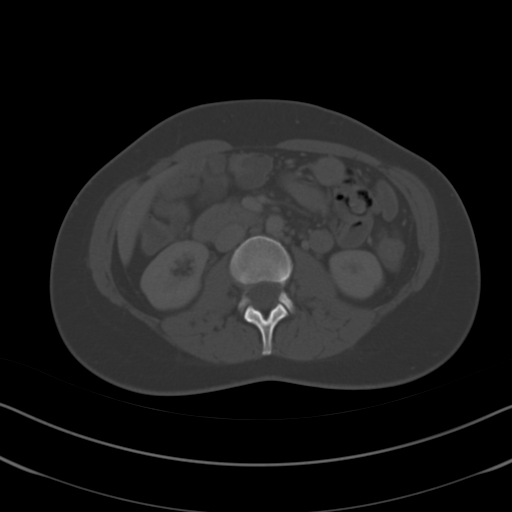
[im 61/87  soft-tissue]
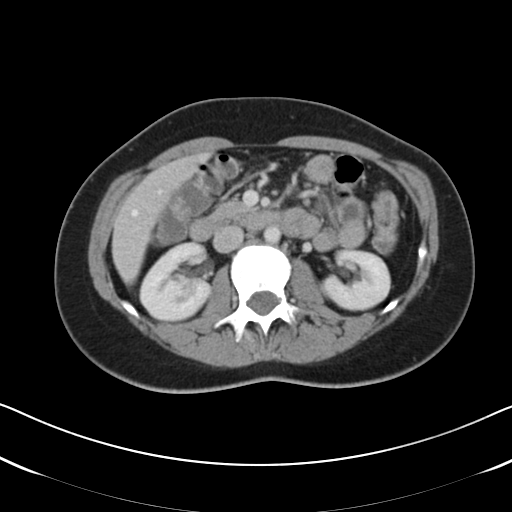
[im 69/87  soft-tissue]
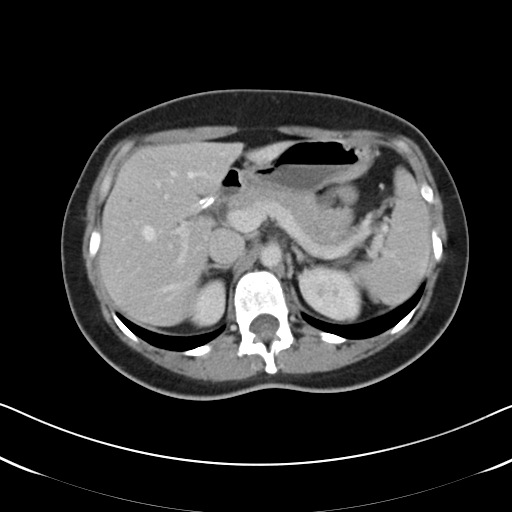
[im 72/87  lung]
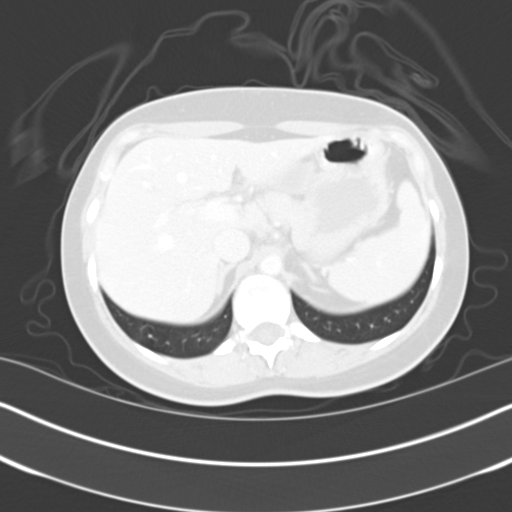
[im 76/87  soft-tissue]
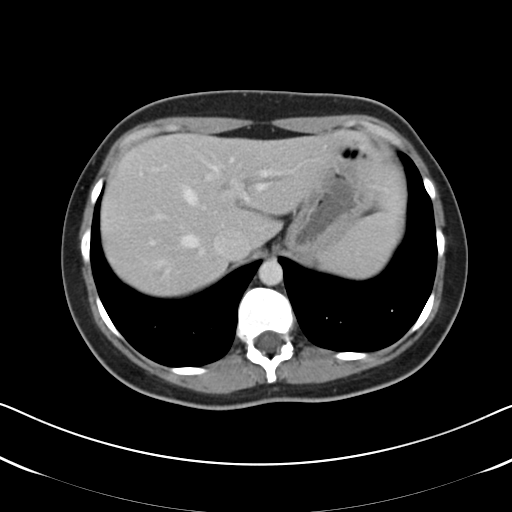
[im 76/87  lung]
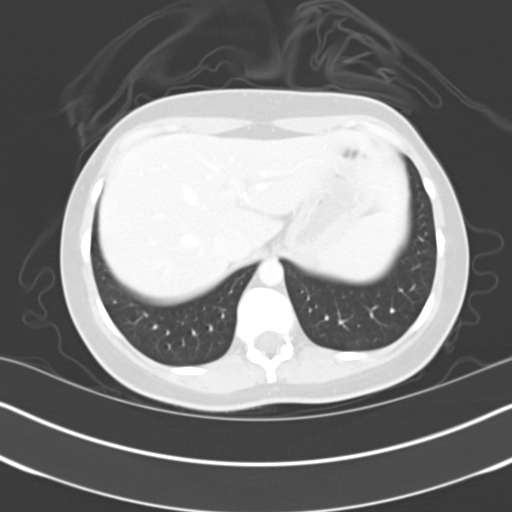
[im 79/87  lung]
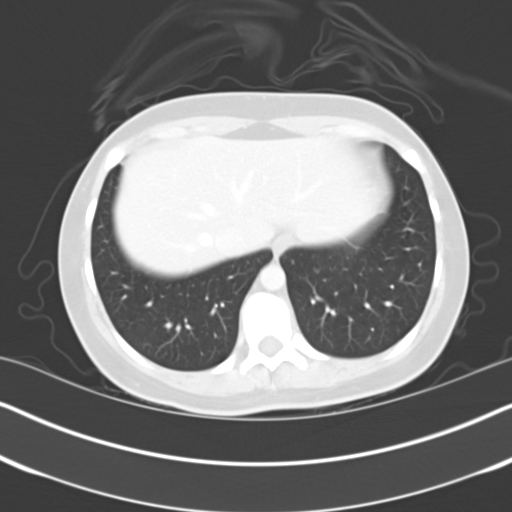
[im 83/87  soft-tissue]
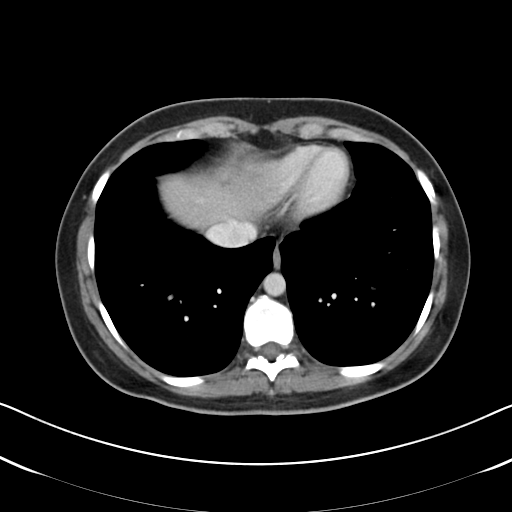
[im 83/87  lung]
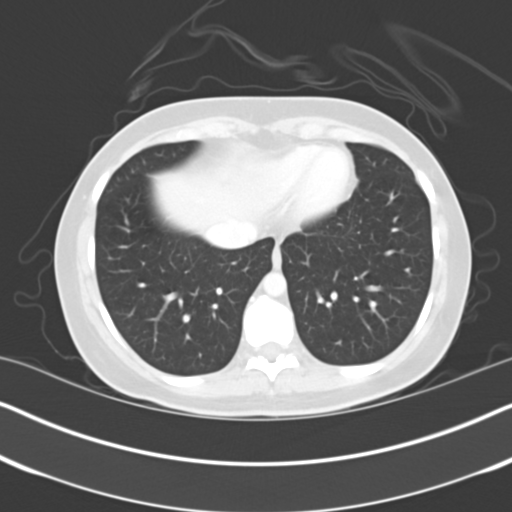

[15 of 32 positions shown; findings below may reference images not displayed]

FINDINGS: The visualized lung bases are clear.

The liver and spleen are unremarkable in appearance. The patient is
status post cholecystectomy, with clips noted at the gallbladder
fossa. The pancreas and adrenal glands are unremarkable.

The kidneys are unremarkable in appearance. There is no evidence of
hydronephrosis. No renal or ureteral stones are seen. No perinephric
stranding is appreciated.

The small bowel is unremarkable in appearance. The stomach is within
normal limits. No acute vascular abnormalities are seen.

The appendix is not definitely seen; there is no evidence of
appendicitis. The colon is partially filled with fluid and is
grossly unremarkable in appearance.

The bladder is mildly distended and grossly unremarkable. The uterus
is within normal limits. The right ovary is unremarkable in
appearance. Trace free fluid within the pelvis is likely physiologic
in nature. No suspicious adnexal masses are seen. No inguinal
lymphadenopathy is seen.

No acute osseous abnormalities are identified.
IMPRESSION: 1. No acute abnormality seen within the abdomen or pelvis.
2. Trace free fluid in the pelvis is likely physiologic in nature.

## 2016-09-10 IMAGING — CR DG ABDOMEN 2V
1 series · 2 of 2 positions shown · non-contrast
Comparison: CT scan from 07/18/2014.

CLINICAL DATA: Initial encounter for cramping with nausea and
vomiting since 8 a.m. this morning. Pain in the mid to lower right
side of abdomen.

EXAM:
ABDOMEN - 2 VIEW

[Series 1: w abdomen upright · 0.14mm/px · 2 of 2 slices shown]
[im 1/2]
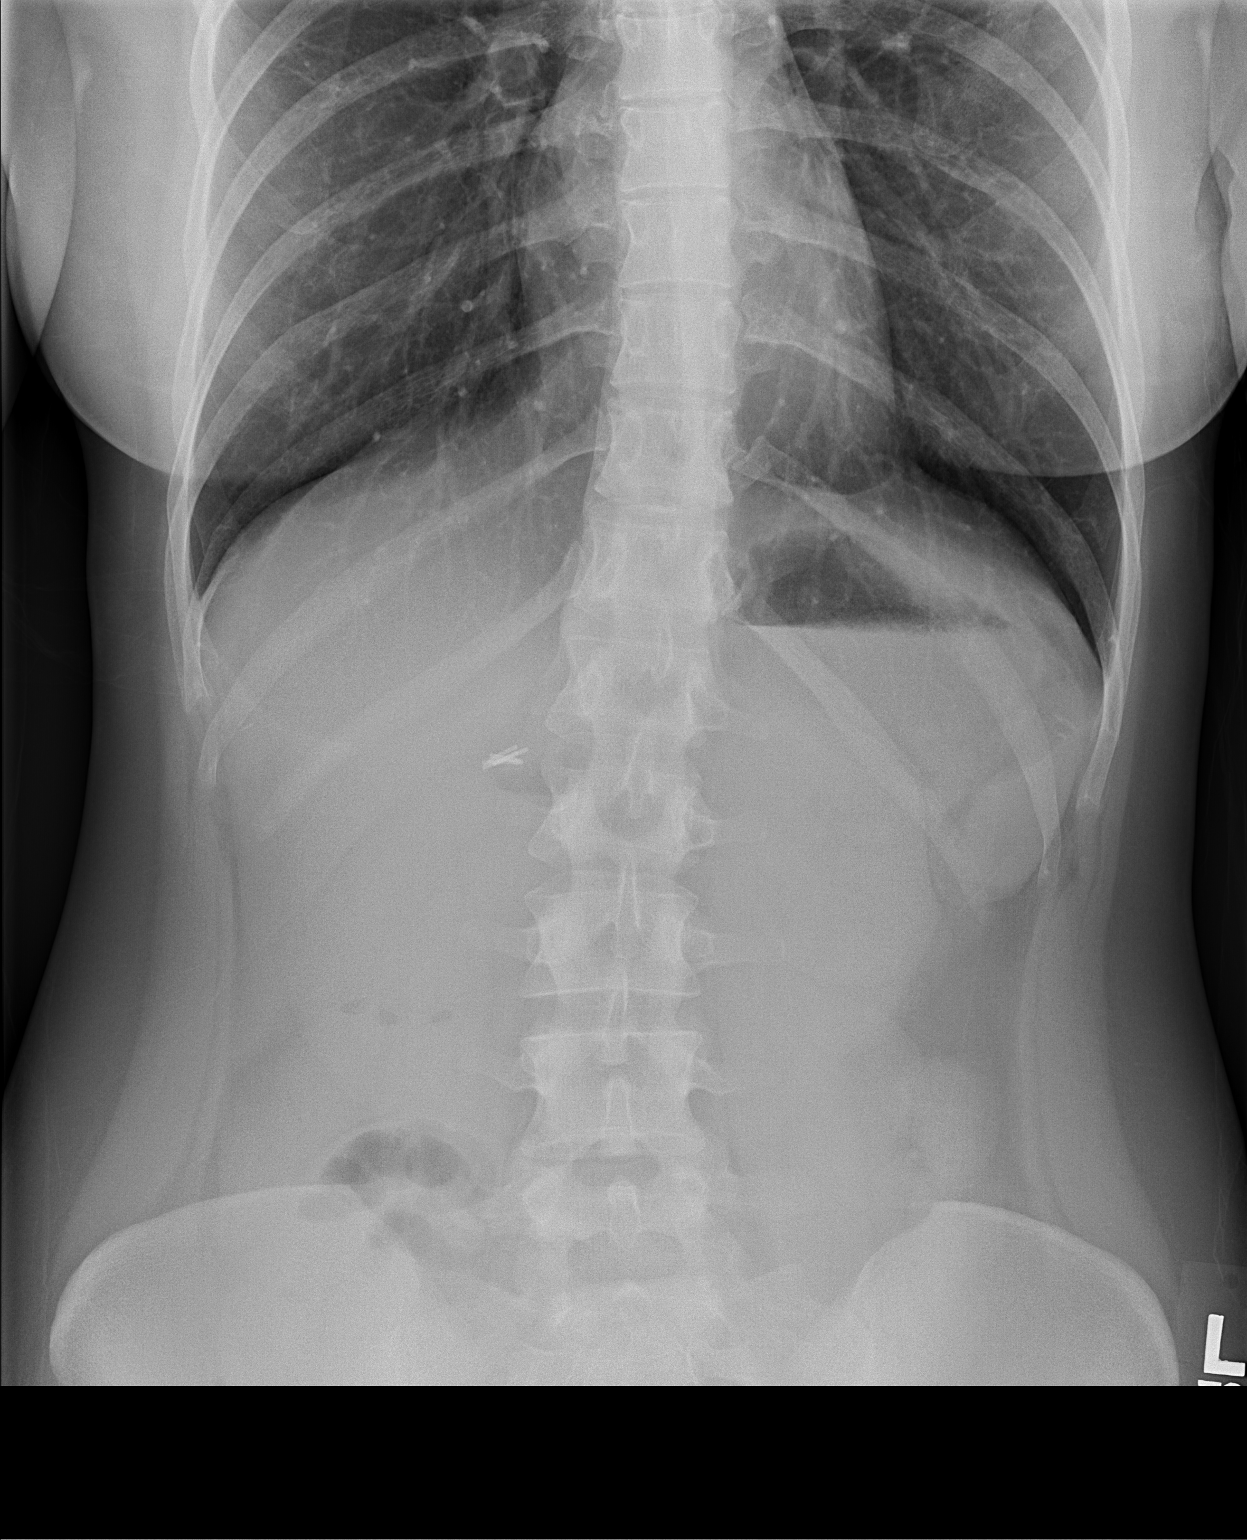
[im 2/2]
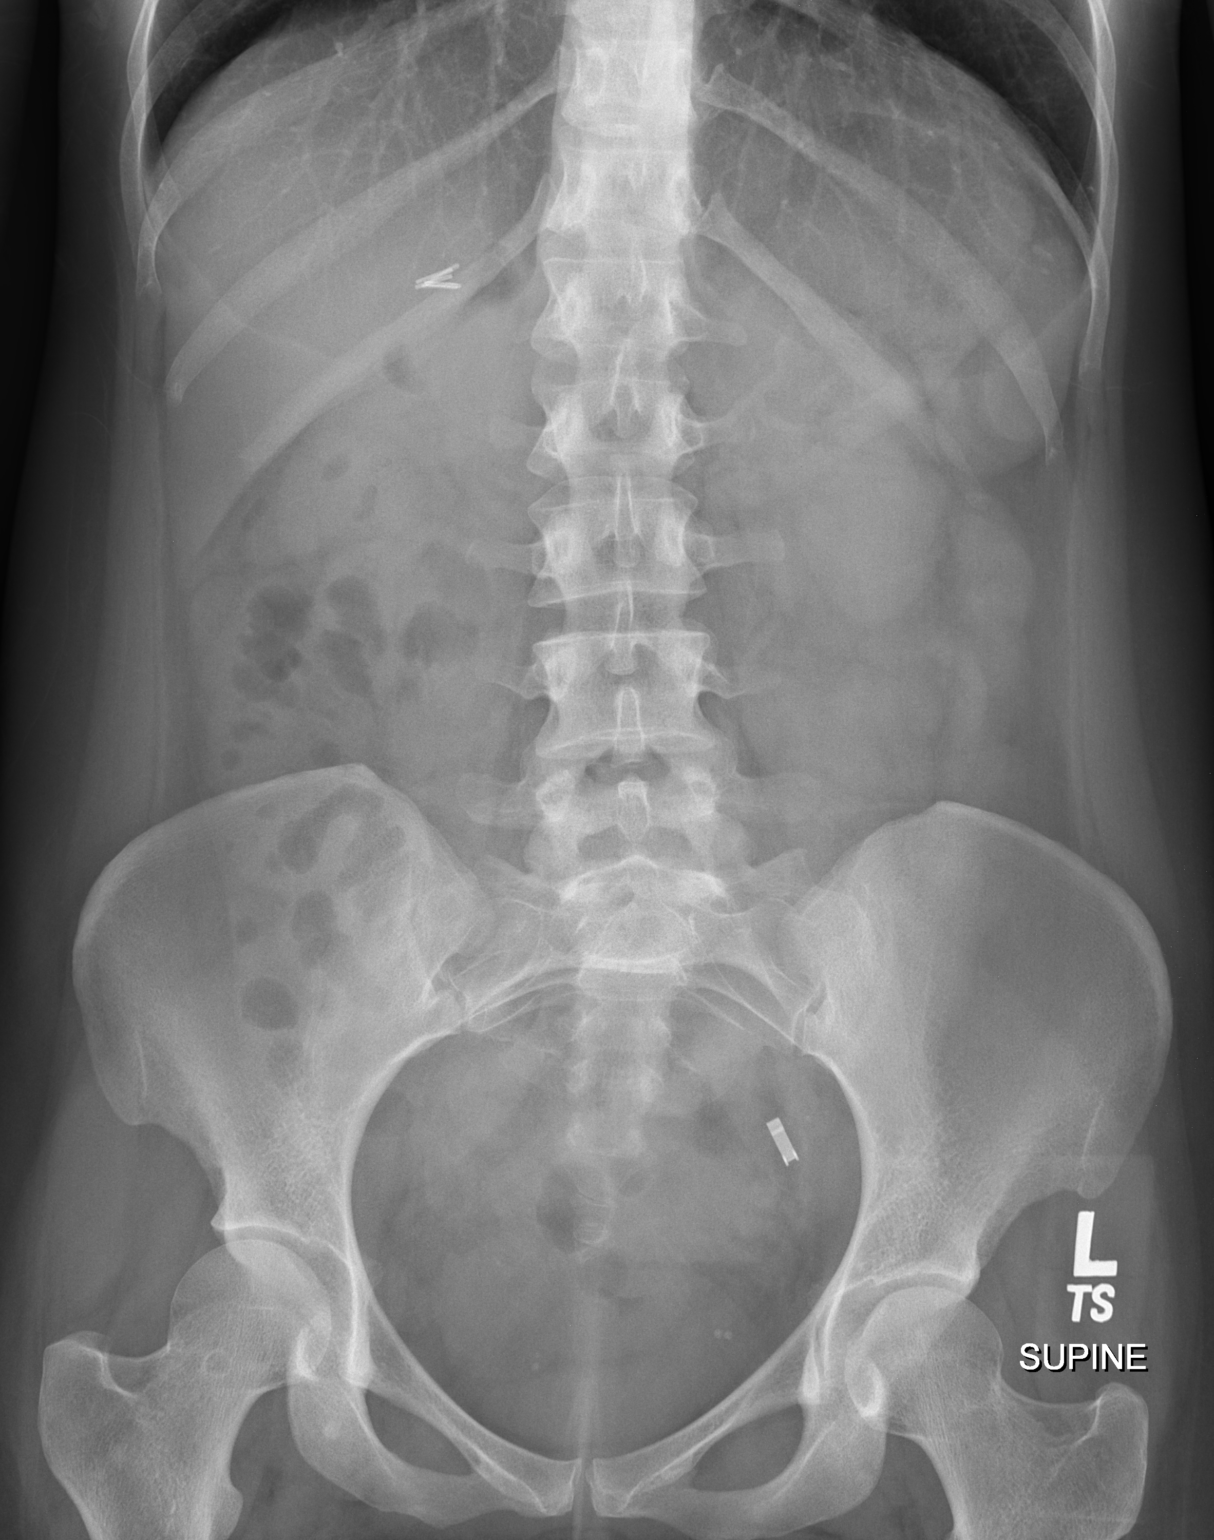

[2 of 2 positions shown; findings below may reference images not displayed]

FINDINGS: Upright film shows no evidence for intraperitoneal free air. There
is no evidence for gaseous bowel dilation to suggest obstruction.
Surgical clips in the right upper quadrant suggest prior
cholecystectomy. Tubal ligation clip noted in the left anatomic
pelvis. Visualized bony anatomy is unremarkable.
IMPRESSION: No evidence for intraperitoneal free air or bowel obstruction.

## 2017-03-28 ENCOUNTER — Emergency Department (HOSPITAL_COMMUNITY): Payer: Self-pay

## 2017-03-28 ENCOUNTER — Emergency Department (HOSPITAL_COMMUNITY)
Admission: EM | Admit: 2017-03-28 | Discharge: 2017-03-28 | Disposition: A | Discharge status: Home / Self Care | Payer: Self-pay | Attending: Emergency Medicine | Admitting: Emergency Medicine

## 2017-03-28 ENCOUNTER — Encounter (HOSPITAL_COMMUNITY): Payer: Self-pay | Admitting: Emergency Medicine

## 2017-03-28 DIAGNOSIS — Z87891 Personal history of nicotine dependence: Secondary | ICD-10-CM | POA: Insufficient documentation

## 2017-03-28 DIAGNOSIS — M79661 Pain in right lower leg: Secondary | ICD-10-CM | POA: Insufficient documentation

## 2017-03-28 DIAGNOSIS — M545 Low back pain, unspecified: Secondary | ICD-10-CM

## 2017-03-28 LAB — POC URINE PREG, ED: PREG TEST UR: NEGATIVE

## 2017-03-28 MED ORDER — DIAZEPAM 5 MG PO TABS: 5.0000 mg | ORAL_TABLET | Freq: Two times a day (BID) | ORAL | 0 refills | Status: AC | PRN

## 2017-03-28 MED ORDER — HYDROCODONE-ACETAMINOPHEN 7.5-325 MG/15ML PO SOLN: ORAL | 0 refills | Status: AC

## 2017-03-28 MED ORDER — KETOROLAC TROMETHAMINE 60 MG/2ML IM SOLN
60.0000 mg | Freq: Once | INTRAMUSCULAR | Status: AC
  Administered 2017-03-28: 60 mg via INTRAMUSCULAR
  Filled 2017-03-28: qty 2

## 2017-03-28 MED ORDER — METHOCARBAMOL 500 MG PO TABS
1000.0000 mg | ORAL_TABLET | Freq: Once | ORAL | Status: AC
  Administered 2017-03-28: 1000 mg via ORAL
  Filled 2017-03-28: qty 2

## 2017-03-28 MED ORDER — HYDROCODONE-ACETAMINOPHEN 7.5-325 MG/15ML PO SOLN
15.0000 mL | Freq: Once | ORAL | Status: AC
  Administered 2017-03-28: 15 mL via ORAL
  Filled 2017-03-28: qty 15

## 2017-03-28 NOTE — ED Notes (Signed)
Ortho tech aware that patient needs crutches.

## 2017-03-28 NOTE — ED Notes (Signed)
Taken to xray at this time. 

## 2017-03-28 NOTE — ED Notes (Signed)
Dr. Clayborne Dana aware of low blood pressure of 101/67.  Permission given to give pain medication.
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
## 2017-03-28 NOTE — Progress Notes (Signed)
Orthopedic Tech Progress Note Patient Details:  Leah Christensen 1980/08/17 [id number]  Ortho Devices Type of Ortho Device: Crutches Ortho Device/Splint Interventions: Ordered, Application, Adjustment   Trinna Post 03/28/2017, 11:08 PM

## 2017-03-28 NOTE — ED Notes (Addendum)
PT states understanding of care given, follow up care, and medication prescribed. PT ambulated from ED to car with a steady gait. 
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
## 2017-03-28 NOTE — ED Notes (Signed)
Reports pain in leg gone as long as she doesn't move it.  C/o pain in lower back rated at 8/10.  Dr Clayborne Dana at the bedside.

## 2021-02-09 ENCOUNTER — Emergency Department
Admission: EM | Admit: 2021-02-09 | Discharge: 2021-02-09 | Disposition: A | Discharge status: Home / Self Care | Payer: Self-pay | Attending: Emergency Medicine | Admitting: Emergency Medicine

## 2021-02-09 DIAGNOSIS — N390 Urinary tract infection, site not specified: Secondary | ICD-10-CM | POA: Insufficient documentation

## 2021-02-09 DIAGNOSIS — B9689 Other specified bacterial agents as the cause of diseases classified elsewhere: Secondary | ICD-10-CM | POA: Insufficient documentation

## 2021-02-09 DIAGNOSIS — Z87891 Personal history of nicotine dependence: Secondary | ICD-10-CM | POA: Insufficient documentation

## 2021-02-09 LAB — CBC
HCT: 37 % (ref 36.0–46.0)
Hemoglobin: 12.6 g/dL (ref 12.0–15.0)
MCH: 32.3 pg (ref 26.0–34.0)
MCHC: 34.1 g/dL (ref 30.0–36.0)
MCV: 94.9 fL (ref 80.0–100.0)
Platelets: 272 10*3/uL (ref 150–400)
RBC: 3.9 MIL/uL (ref 3.87–5.11)
RDW: 12.8 % (ref 11.5–15.5)
WBC: 6.6 10*3/uL (ref 4.0–10.5)
nRBC: 0 % (ref 0.0–0.2)

## 2021-02-09 LAB — BASIC METABOLIC PANEL
Anion gap: 4 — ABNORMAL LOW (ref 5–15)
BUN: 9 mg/dL (ref 6–20)
CO2: 28 mmol/L (ref 22–32)
Calcium: 9.4 mg/dL (ref 8.9–10.3)
Chloride: 106 mmol/L (ref 98–111)
Creatinine, Ser: 0.75 mg/dL (ref 0.44–1.00)
GFR, Estimated: >60 mL/min (ref >60)
Glucose, Bld: 92 mg/dL (ref 70–99)
Potassium: 3.8 mmol/L (ref 3.5–5.1)
Sodium: 138 mmol/L (ref 135–145)

## 2021-02-09 LAB — URINALYSIS, COMPLETE (UACMP) WITH MICROSCOPIC
Bilirubin Urine: NEGATIVE
Glucose, UA: NEGATIVE mg/dL
Ketones, ur: NEGATIVE mg/dL
Nitrite: NEGATIVE
Protein, ur: NEGATIVE mg/dL
Specific Gravity, Urine: <1.005 — ABNORMAL LOW (ref 1.005–1.030)
pH: 6 (ref 5.0–8.0)

## 2021-02-09 MED ORDER — CEPHALEXIN 250 MG/5ML PO SUSR: 500.0000 mg | Freq: Four times a day (QID) | ORAL | 0 refills | Status: AC

## 2021-02-09 MED ORDER — SODIUM CHLORIDE 0.9 % IV SOLN
1.0000 g | Freq: Once | INTRAVENOUS | Status: AC
  Administered 2021-02-09: 1 g via INTRAVENOUS
  Filled 2021-02-09: qty 10

## 2021-02-09 MED ORDER — PHENAZOPYRIDINE HCL 100 MG PO TABS
100.0000 mg | ORAL_TABLET | Freq: Once | ORAL | Status: AC
  Administered 2021-02-09: 100 mg via ORAL
  Filled 2021-02-09: qty 1

## 2021-02-09 MED ORDER — PHENAZOPYRIDINE HCL 100 MG PO TABS: 100.0000 mg | ORAL_TABLET | Freq: Three times a day (TID) | ORAL | 0 refills | Status: AC | PRN

## 2021-02-09 NOTE — ED Triage Notes (Signed)
Pt presents to the ED with complaints of painful urination, frequency, and now bilateral flank pain. Pt reports that symptoms first started Tuesday and has progressed. Pt denies odor to urine, but does state that it is orange due to AZO.

## 2021-02-09 NOTE — Discharge Instructions (Addendum)
Please seek medical attention for any high fevers, chest pain, shortness of breath, change in behavior, persistent vomiting, bloody stool or any other new or concerning symptoms.  

## 2021-02-09 NOTE — ED Provider Notes (Signed)
[facility] Emergency Department Provider Note   ____________________________________________   I have reviewed the triage vital signs and the nursing notes.   HISTORY  Chief Complaint Dysuria and Flank Pain   History limited by: Not Limited   HPI Leah Christensen is a 40 y.o. female who presents to the emergency department today because of concerns for dysuria and bilateral flank pain.  Patient states that her symptoms started roughly 1 week ago.  It started with some dysuria and bad odor to her urine.  She then started developing worsening abdominal discomfort.  The patient states that it has started moving up bilateral sides.  The patient did try taking some Azo at home without any relief.  She states she has had UTIs in the past but have never been quite this painful.  Patient denies any fevers.  Has had some nausea.   Records reviewed. Per medical record review patient has a history of acid reflux.   Past Medical History:  Diagnosis Date   Acid reflux    Frequent headaches     Patient Active Problem List   Diagnosis Date Noted   Preventative health care 05/15/2015   Breast mass 05/10/2015   Breast pain 04/27/2015   Frequent headaches 04/27/2015   Acid reflux     Past Surgical History:  Procedure Laterality Date   OOPHORECTOMY Left    TUBAL LIGATION  2009    Prior to Admission medications   Medication Sig Start Date End Date Taking? Authorizing Provider  diazepam (VALIUM) 5 MG tablet Take 1 tablet (5 mg total) by mouth every 12 (twelve) hours as needed (spasms). 03/28/17   Mesner, Barbara Cower, MD  HYDROcodone-acetaminophen (HYCET) 7.5-325 mg/15 ml solution 15 mL by mouth every 8 hours as needed for pain 03/28/17   Mesner, Barbara Cower, MD    Allergies Sulfamethoxazole-trimethoprim  Family History  Problem Relation Age of Onset   Alcohol abuse Father    Alcohol abuse Maternal Aunt    Mental illness Maternal Uncle    Alcohol abuse Maternal  Grandmother    Arthritis Maternal Grandmother    Hypertension Maternal Grandmother    Pancreatic cancer Maternal Grandmother     Social History Social History   Tobacco Use   Smoking status: Former   Smokeless tobacco: Never  Building services engineer Use: Never used  Substance Use Topics   Alcohol use: No    Alcohol/week: 0.0 standard drinks    Comment: former   Drug use: No    Review of Systems Constitutional: No fever/chills Eyes: No visual changes. ENT: No sore throat. Cardiovascular: Denies chest pain. Respiratory: Denies shortness of breath. Gastrointestinal: Positive for abdominal pain, bilateral flank pain. Genitourinary: Positive for dysuria. Musculoskeletal: Negative for back pain. Skin: Negative for rash. Neurological: Negative for headaches, focal weakness or numbness.  ____________________________________________   PHYSICAL EXAM:  VITAL SIGNS: ED Triage Vitals  Enc Vitals Group     BP 02/09/21 1723 (!) 109/52     Pulse Rate 02/09/21 1723 60     Resp 02/09/21 1723 18     Temp 02/09/21 1723 98.1 F (36.7 C)     Temp Source 02/09/21 1723 Oral     SpO2 02/09/21 1723 99 %     Weight 02/09/21 1724 115 lb (52.2 kg)     Height 02/09/21 1724 5\' 4"  (1.626 m)     Head Circumference --      Peak Flow --      Pain Score 02/09/21  1723 7   Constitutional: Alert and oriented.  Eyes: Conjunctivae are normal.  ENT      Head: Normocephalic and atraumatic.      Nose: No congestion/rhinnorhea.      Mouth/Throat: Mucous membranes are moist.      Neck: No stridor. Hematological/Lymphatic/Immunilogical: No cervical lymphadenopathy. Cardiovascular: Normal rate, regular rhythm.  No murmurs, rubs, or gallops.  Respiratory: Normal respiratory effort without tachypnea nor retractions. Breath sounds are clear and equal bilaterally. No wheezes/rales/rhonchi. Gastrointestinal: Soft and slightly diffusely tender to palpation.  Genitourinary: Deferred Musculoskeletal: Normal  range of motion in all extremities. No lower extremity edema. Neurologic:  Normal speech and language. No gross focal neurologic deficits are appreciated.  Skin:  Skin is warm, dry and intact. No rash noted. Psychiatric: Mood and affect are normal. Speech and behavior are normal. Patient exhibits appropriate insight and judgment.  ____________________________________________    LABS (pertinent positives/negatives)  BMP wnl except anion gap 4 CBC wbc 6.6, hgb 12.6, plt 272 UA clear, small hgb dipstick, small leukocytes, 11-20 WBC, rare bacteria  ____________________________________________   EKG  None  ____________________________________________    RADIOLOGY  None  ____________________________________________   PROCEDURES  Procedures  ____________________________________________   INITIAL IMPRESSION / ASSESSMENT AND PLAN / ED COURSE  Pertinent labs & imaging results that were available during my care of the patient were reviewed by me and considered in my medical decision making (see chart for details).   Patient presented to the emergency department today with concerns for dysuria, bilateral flank pain.  Patient's blood work here without any leukocytosis.  Urine is consistent with infection.  At this time I do think lower urinary tract infection is likely patient's etiology.  I did discuss with patient possibility of kidney stones although she has no history of kidney stones and the fact that the pain is bilateral makes me less concern for kidney stones.  She did have some small women to expect but at this time I think risk of CT outweighs the low probability of finding a stone.  Will give patient dose of antibiotics here in the emergency department.  Will plan on discharging with further antibiotics. Encouraged PCP follow up.  ____________________________________________   FINAL CLINICAL IMPRESSION(S) / ED DIAGNOSES  Final diagnoses:  Lower urinary tract infectious  disease     Note: This dictation was prepared with Dragon dictation. Any transcriptional errors that result from this process are unintentional     Phineas Semen, MD 02/09/21 2238

## 2022-11-06 ENCOUNTER — Encounter: Payer: Self-pay | Admitting: Emergency Medicine

## 2022-11-06 ENCOUNTER — Other Ambulatory Visit: Payer: Self-pay

## 2022-11-06 ENCOUNTER — Emergency Department: Payer: Self-pay

## 2022-11-06 ENCOUNTER — Emergency Department
Admission: EM | Admit: 2022-11-06 | Discharge: 2022-11-06 | Disposition: A | Discharge status: Home / Self Care | Payer: Self-pay | Attending: Emergency Medicine | Admitting: Emergency Medicine

## 2022-11-06 DIAGNOSIS — W2209XA Striking against other stationary object, initial encounter: Secondary | ICD-10-CM | POA: Insufficient documentation

## 2022-11-06 DIAGNOSIS — M25531 Pain in right wrist: Secondary | ICD-10-CM | POA: Insufficient documentation

## 2022-11-06 NOTE — Discharge Instructions (Addendum)
Rest Apply ice to affected area Elevate above heart level  Use ACE wrap for compression F/U with Orthopedics if pain does not improve in 3 days   Take otc tylenol or ibuprofen for pain as needed.

## 2022-11-06 NOTE — ED Provider Notes (Signed)
[facility] Emergency Department Provider Note     Event Date/Time   First MD Initiated Contact with Patient 11/06/22 1831     (approximate)   History   Wrist Pain   HPI  Leah Christensen is a 42 y.o. female to the ED for evaluation of right wrist pain.  The patient reports she was playing with her eldest daughter and struck her right wrist against a bucket.  The patient reports sudden pain after impact localized to the right dorsal aspect of hand.  She describes the pain as sharp and throbbing that has continued since the injury.  Radiation from her thumb side of hand up to her shoulder.  Pain at rest 6/10 and with movement increases to 9/10. No other injuries at this time.  She denies numbness, tingling, loss of motor function in her fingers or hand.      Physical Exam   Triage Vital Signs: ED Triage Vitals  Enc Vitals Group     BP 11/06/22 1819 (!) 117/104     Pulse Rate 11/06/22 1819 81     Resp 11/06/22 1819 18     Temp 11/06/22 1819 98.4 F (36.9 C)     Temp Source 11/06/22 1819 Oral     SpO2 11/06/22 1819 100 %     Weight --      Height --      Head Circumference --      Peak Flow --      Pain Score 11/06/22 1818 10     Pain Loc --      Pain Edu? --      Excl. in GC? --     Most recent vital signs: Vitals:   11/06/22 1819 11/06/22 2009  BP: (!) 117/104 118/86  Pulse: 81 77  Resp: 18   Temp: 98.4 F (36.9 C) 98.1 F (36.7 C)  SpO2: 100% 98%    General Awake, no distress.  HEENT NCAT. PERRL. EOMI.  CV:  Good peripheral perfusion.  RESP:  Normal effort.  ABD:  No distention.  Other:  Right hand reveals mild swelling over dorsal aspect of the 2nd MCP joint, no deformity. Mild ecchymosis. Tenderness over 2nd MCP joint and distal radius. Full active ROM limited to pain. Full passive ROM without difficulty. Capillary refill time < 2sec. Neurovascular status intact.    ED Results / Procedures / Treatments   Labs (all labs  ordered are listed, but only abnormal results are displayed) Labs Reviewed - No data to display  RADIOLOGY  I personally viewed and evaluated these images as part of my medical decision making, as well as reviewing the written report by the radiologist.  ED Provider Interpretation: Unremarkable  DG Wrist Complete Right  Result Date: 11/06/2022 CLINICAL DATA:  Hand injury. Pain radiating into the wrist and forearm. EXAM: RIGHT FOREARM - 2 VIEW; RIGHT WRIST - COMPLETE 3+ VIEW COMPARISON:  None Available. FINDINGS: The mineralization and alignment are normal. There is no evidence of acute fracture or dislocation. The joint spaces are preserved. There is a moderate ulnar minus variance at the wrist (normal variant). No foreign body or focal soft tissue swelling identified. No evidence of elbow joint effusion. IMPRESSION: No evidence of acute fracture or dislocation in the right wrist or forearm. Moderate ulnar minus variance at the wrist. Electronically Signed   By: Carey Bullocks M.D.   On: 11/06/2022 18:55   DG Forearm Right  Result Date: 11/06/2022 CLINICAL DATA:  Hand injury. Pain radiating into the wrist and forearm. EXAM: RIGHT FOREARM - 2 VIEW; RIGHT WRIST - COMPLETE 3+ VIEW COMPARISON:  None Available. FINDINGS: The mineralization and alignment are normal. There is no evidence of acute fracture or dislocation. The joint spaces are preserved. There is a moderate ulnar minus variance at the wrist (normal variant). No foreign body or focal soft tissue swelling identified. No evidence of elbow joint effusion. IMPRESSION: No evidence of acute fracture or dislocation in the right wrist or forearm. Moderate ulnar minus variance at the wrist. Electronically Signed   By: Carey Bullocks M.D.   On: 11/06/2022 18:55     PROCEDURES:  Critical Care performed: No  Procedures   MEDICATIONS ORDERED IN ED: Medications - No data to display   IMPRESSION / MDM / ASSESSMENT AND PLAN / ED COURSE  I  reviewed the triage vital signs and the nursing notes.                              Differential diagnosis includes, but is not limited to, contusion, sprain, fracture.  Patient's presentation is most consistent with acute complicated illness / injury requiring diagnostic workup.  42 year old female presents to the ED for evaluation of sharp, localized right hand and wrist pain with ascending radiation following striking her hand on a mop bucket.  See HPI.  Patient refused pain management and symptom control in ED.  X-ray reassuring of fracture and bony abnormalities.  The patient's overall clinical presentation is consistent with wrist sprain. Patient will be discharged home with instructions to take over-the-counter ibuprofen as needed.  I advised immobilization with wrist splint and sling for support and comfort for a brief period following discharge.  I encouraged application of ice to reduce swelling at home and compression using an ACE wrap with elevation of extremity as often while at home. Patient is to follow up with her primary care as needed or otherwise directed. Patient is given ED precautions to return to the ED for any worsening or new symptoms.  Patient verbalized full understanding and agrees with assessment and plan.     FINAL CLINICAL IMPRESSION(S) / ED DIAGNOSES   Final diagnoses:  Acute pain of right wrist     Rx / DC Orders   ED Discharge Orders     None        Note:  This document was prepared using Dragon voice recognition software and may include unintentional dictation errors.    Romeo Apple, [place] A, PA-C 11/07/22 [id number]    Pilar Jarvis, MD 11/12/22 [phone]

## 2022-11-06 NOTE — ED Triage Notes (Signed)
Patient to ED via POV for right wrist/forearm injury. Patient states she was running and hit it on mop bucket.
